# Patient Record
Sex: Female | Born: 1995 | Race: White | Hispanic: No | Marital: Single | Smoking: Never smoker
Health system: Southern US, Community
[De-identification: ages and names within clinical notes are randomized; demographics above are authoritative.]

## PROBLEM LIST (undated history)

## (undated) DIAGNOSIS — K648 Other hemorrhoids: Secondary | ICD-10-CM

## (undated) DIAGNOSIS — D649 Anemia, unspecified: Secondary | ICD-10-CM

## (undated) HISTORY — DX: Anemia, unspecified: D64.9

## (undated) HISTORY — PX: OTHER SURGICAL HISTORY: SHX169

## (undated) HISTORY — DX: Other hemorrhoids: K64.8

---
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
## 2016-09-24 ENCOUNTER — Observation Stay (HOSPITAL_COMMUNITY)
Admission: EM | Admit: 2016-09-24 | Discharge: 2016-09-26 | Disposition: A | Discharge status: Home / Self Care | Payer: 59 | Attending: Obstetrics and Gynecology | Admitting: Obstetrics and Gynecology

## 2016-09-24 ENCOUNTER — Encounter (HOSPITAL_COMMUNITY): Payer: Self-pay | Admitting: Emergency Medicine

## 2016-09-24 DIAGNOSIS — Z79899 Other long term (current) drug therapy: Secondary | ICD-10-CM | POA: Diagnosis not present

## 2016-09-24 DIAGNOSIS — K5900 Constipation, unspecified: Secondary | ICD-10-CM | POA: Diagnosis not present

## 2016-09-24 DIAGNOSIS — N83209 Unspecified ovarian cyst, unspecified side: Secondary | ICD-10-CM | POA: Diagnosis present

## 2016-09-24 DIAGNOSIS — R109 Unspecified abdominal pain: Secondary | ICD-10-CM

## 2016-09-24 DIAGNOSIS — N83202 Unspecified ovarian cyst, left side: Secondary | ICD-10-CM | POA: Diagnosis not present

## 2016-09-24 DIAGNOSIS — R1031 Right lower quadrant pain: Secondary | ICD-10-CM | POA: Diagnosis present

## 2016-09-24 LAB — CBC WITH DIFFERENTIAL/PLATELET
BASOS ABS: 0 10*3/uL (ref 0.0–0.1)
Basophils Relative: 0 %
Eosinophils Absolute: 0.1 10*3/uL (ref 0.0–0.7)
Eosinophils Relative: 1 %
HCT: 41.3 % (ref 36.0–46.0)
Hemoglobin: 14.2 g/dL (ref 12.0–15.0)
LYMPHS ABS: 2.6 10*3/uL (ref 0.7–4.0)
LYMPHS PCT: 34 %
MCH: 31.2 pg (ref 26.0–34.0)
MCHC: 34.4 g/dL (ref 30.0–36.0)
MCV: 90.8 fL (ref 78.0–100.0)
Monocytes Absolute: 0.5 10*3/uL (ref 0.1–1.0)
Monocytes Relative: 6 %
NEUTROS ABS: 4.5 10*3/uL (ref 1.7–7.7)
NEUTROS PCT: 59 %
PLATELETS: 238 10*3/uL (ref 150–400)
RBC: 4.55 MIL/uL (ref 3.87–5.11)
RDW: 12.8 % (ref 11.5–15.5)
WBC: 7.7 10*3/uL (ref 4.0–10.5)

## 2016-09-24 LAB — COMPREHENSIVE METABOLIC PANEL
ALBUMIN: 4.9 g/dL (ref 3.5–5.0)
ALT: 17 U/L (ref 14–54)
ANION GAP: 7 (ref 5–15)
AST: 23 U/L (ref 15–41)
Alkaline Phosphatase: 45 U/L (ref 38–126)
BUN: 12 mg/dL (ref 6–20)
CO2: 25 mmol/L (ref 22–32)
Calcium: 10.1 mg/dL (ref 8.9–10.3)
Chloride: 109 mmol/L (ref 101–111)
Creatinine, Ser: 0.85 mg/dL (ref 0.44–1.00)
GFR calc Af Amer: >60 mL/min (ref >60)
GFR calc non Af Amer: >60 mL/min (ref >60)
GLUCOSE: 101 mg/dL — AB (ref 65–99)
POTASSIUM: 3.8 mmol/L (ref 3.5–5.1)
SODIUM: 141 mmol/L (ref 135–145)
Total Bilirubin: 0.4 mg/dL (ref 0.3–1.2)
Total Protein: 7.8 g/dL (ref 6.5–8.1)

## 2016-09-24 LAB — URINALYSIS, ROUTINE W REFLEX MICROSCOPIC
Bilirubin Urine: NEGATIVE
GLUCOSE, UA: NEGATIVE mg/dL
Hgb urine dipstick: NEGATIVE
Ketones, ur: 15 mg/dL — AB
Leukocytes, UA: NEGATIVE
Nitrite: NEGATIVE
PH: 7 (ref 5.0–8.0)
Protein, ur: NEGATIVE mg/dL
SPECIFIC GRAVITY, URINE: 1.014 (ref 1.005–1.030)

## 2016-09-24 LAB — I-STAT BETA HCG BLOOD, ED (MC, WL, AP ONLY): I-stat hCG, quantitative: <5 m[IU]/mL (ref <5)

## 2016-09-24 LAB — LIPASE, BLOOD: Lipase: 20 U/L (ref 11–51)

## 2016-09-24 MED ORDER — SODIUM CHLORIDE 0.9 % IV SOLN
INTRAVENOUS | Status: DC
  Administered 2016-09-24: 18:00:00 via INTRAVENOUS

## 2016-09-24 MED ORDER — MAGNESIUM CITRATE PO SOLN
1.0000 | Freq: Once | ORAL | Status: AC
  Administered 2016-09-24: 1 via ORAL
  Filled 2016-09-24: qty 296

## 2016-09-24 MED ORDER — ACETAMINOPHEN 650 MG RE SUPP: 650.0000 mg | Freq: Four times a day (QID) | RECTAL | Status: DC | PRN

## 2016-09-24 MED ORDER — ACETAMINOPHEN 325 MG PO TABS
650.0000 mg | ORAL_TABLET | Freq: Four times a day (QID) | ORAL | Status: DC | PRN
Start: 2016-09-24 — End: 2016-09-26
  Administered 2016-09-25: 650 mg via ORAL
  Filled 2016-09-24: qty 2

## 2016-09-24 MED ORDER — DIPHENHYDRAMINE HCL 25 MG PO CAPS: 25.0000 mg | ORAL_CAPSULE | Freq: Four times a day (QID) | ORAL | Status: DC | PRN

## 2016-09-24 MED ORDER — ONDANSETRON HCL 4 MG/2ML IJ SOLN
4.0000 mg | Freq: Once | INTRAMUSCULAR | Status: AC
  Administered 2016-09-24: 4 mg via INTRAVENOUS
  Filled 2016-09-24: qty 2

## 2016-09-24 MED ORDER — LORAZEPAM 2 MG/ML IJ SOLN
0.5000 mg | Freq: Once | INTRAMUSCULAR | Status: AC
  Administered 2016-09-24: 0.5 mg via INTRAVENOUS
  Filled 2016-09-24: qty 1

## 2016-09-24 MED ORDER — ONDANSETRON 4 MG PO TBDP
4.0000 mg | ORAL_TABLET | Freq: Four times a day (QID) | ORAL | Status: DC | PRN
  Administered 2016-09-25: 4 mg via ORAL

## 2016-09-24 MED ORDER — MORPHINE SULFATE (PF) 4 MG/ML IV SOLN
4.0000 mg | Freq: Once | INTRAVENOUS | Status: AC
  Administered 2016-09-24: 4 mg via INTRAVENOUS
  Filled 2016-09-24: qty 1

## 2016-09-24 MED ORDER — MORPHINE SULFATE (PF) 2 MG/ML IV SOLN
2.0000 mg | INTRAVENOUS | Status: DC | PRN
  Administered 2016-09-24: 4 mg via INTRAVENOUS
  Administered 2016-09-25 (×2): 2 mg via INTRAVENOUS
  Administered 2016-09-25 (×2): 4 mg via INTRAVENOUS
  Filled 2016-09-24 (×2): qty 2
  Filled 2016-09-24 (×2): qty 1
  Filled 2016-09-24: qty 2

## 2016-09-24 MED ORDER — ONDANSETRON HCL 4 MG/2ML IJ SOLN
4.0000 mg | Freq: Four times a day (QID) | INTRAMUSCULAR | Status: DC | PRN
  Filled 2016-09-24: qty 2

## 2016-09-24 MED ORDER — BISACODYL 5 MG PO TBEC
5.0000 mg | DELAYED_RELEASE_TABLET | Freq: Every day | ORAL | Status: DC | PRN
  Filled 2016-09-24: qty 1

## 2016-09-24 MED ORDER — KCL IN DEXTROSE-NACL 20-5-0.45 MEQ/L-%-% IV SOLN
INTRAVENOUS | Status: DC
  Administered 2016-09-25 (×2): via INTRAVENOUS
  Filled 2016-09-24 (×4): qty 1000

## 2016-09-24 MED ORDER — HYDROCODONE-ACETAMINOPHEN 5-325 MG PO TABS: 1.0000 | ORAL_TABLET | ORAL | Status: DC | PRN

## 2016-09-24 MED ORDER — DIPHENHYDRAMINE HCL 50 MG/ML IJ SOLN: 25.0000 mg | Freq: Four times a day (QID) | INTRAMUSCULAR | Status: DC | PRN

## 2016-09-24 NOTE — ED Notes (Signed)
Pt reports bilateral hand numbness and tingling. MD made aware.

## 2016-09-24 NOTE — ED Notes (Signed)
General surgery at bedside. 

## 2016-09-24 NOTE — ED Notes (Signed)
Pt being sent from PCP for possible appendicitis

## 2016-09-24 NOTE — Consult Note (Signed)
Reason for Consult:abdominal pain Referring Physician: Vittoria Noreen is an 20 y.o. female.  HPI: 20 yo female with 48 hours of abdominal pain. Pain started Sunday afternoon. She had a bowel movement Sunday morning, she is normally having one formed BM daily. Pain has been located in right lower abdomen. Is constant, worse with movement, nothing has relieved the pain. She has had no previous similar episodes. She has some nausea, but no vomiting. She went to [facility] and underwent workup with CT and [place] both showing only fluid around her ovary. She went to her gynecologist today and was sent to the ER for continued pain. She denies fevers currently.  History reviewed. No pertinent past medical history.  History reviewed. No pertinent surgical history.  History reviewed. No pertinent family history.  Social History:  reports that she has never smoked. She has never used smokeless tobacco. She reports that she does not drink alcohol or use drugs.  Allergies: No Known Allergies  Medications: I have reviewed the patient's current medications.  Results for orders placed or performed during the hospital encounter of 09/24/16 (from the past 48 hour(s))  Lipase, blood     Status: None   Collection Time: 09/24/16  4:34 PM  Result Value Ref Range   Lipase 20 11 - 51 U/L  Comprehensive metabolic panel     Status: Abnormal   Collection Time: 09/24/16  4:34 PM  Result Value Ref Range   Sodium 141 135 - 145 mmol/L   Potassium 3.8 3.5 - 5.1 mmol/L   Chloride 109 101 - 111 mmol/L   CO2 25 22 - 32 mmol/L   Glucose, Bld 101 (H) 65 - 99 mg/dL   BUN 12 6 - 20 mg/dL   Creatinine, Ser 0.85 0.44 - 1.00 mg/dL   Calcium 10.1 8.9 - 10.3 mg/dL   Total Protein 7.8 6.5 - 8.1 g/dL   Albumin 4.9 3.5 - 5.0 g/dL   AST 23 15 - 41 U/L   ALT 17 14 - 54 U/L   Alkaline Phosphatase 45 38 - 126 U/L   Total Bilirubin 0.4 0.3 - 1.2 mg/dL   GFR calc non Af Amer >60 >60 mL/min   GFR calc Af Amer >60 >60  mL/min    Comment: (NOTE) The eGFR has been calculated using the CKD EPI equation. This calculation has not been validated in all clinical situations. eGFR's persistently <60 mL/min signify possible Chronic Kidney Disease.    Anion gap 7 5 - 15  CBC with Differential     Status: None   Collection Time: 09/24/16  4:34 PM  Result Value Ref Range   WBC 7.7 4.0 - 10.5 K/uL   RBC 4.55 3.87 - 5.11 MIL/uL   Hemoglobin 14.2 12.0 - 15.0 g/dL   HCT 41.3 36.0 - 46.0 %   MCV 90.8 78.0 - 100.0 fL   MCH 31.2 26.0 - 34.0 pg   MCHC 34.4 30.0 - 36.0 g/dL   RDW 12.8 11.5 - 15.5 %   Platelets 238 150 - 400 K/uL   Neutrophils Relative % 59 %   Neutro Abs 4.5 1.7 - 7.7 K/uL   Lymphocytes Relative 34 %   Lymphs Abs 2.6 0.7 - 4.0 K/uL   Monocytes Relative 6 %   Monocytes Absolute 0.5 0.1 - 1.0 K/uL   Eosinophils Relative 1 %   Eosinophils Absolute 0.1 0.0 - 0.7 K/uL   Basophils Relative 0 %   Basophils Absolute 0.0 0.0 -  0.1 K/uL  I-Stat beta hCG blood, ED     Status: None   Collection Time: 09/24/16  4:44 PM  Result Value Ref Range   I-stat hCG, quantitative <5.0 <5 mIU/mL   Comment 3            Comment:   GEST. AGE      CONC.  (mIU/mL)   <=1 WEEK        5 - 50     2 WEEKS       50 - 500     3 WEEKS       100 - 10,000     4 WEEKS     1,000 - 30,000        FEMALE AND NON-PREGNANT FEMALE:     LESS THAN 5 mIU/mL     No results found.  Review of Systems  Constitutional: Negative for chills and fever.  HENT: Negative for hearing loss.   Eyes: Negative for blurred vision and double vision.  Respiratory: Negative for cough and hemoptysis.   Cardiovascular: Negative for chest pain and palpitations.  Gastrointestinal: Positive for abdominal pain and nausea. Negative for vomiting.  Genitourinary: Negative for dysuria and urgency.  Musculoskeletal: Negative for myalgias and neck pain.  Skin: Negative for itching and rash.  Neurological: Negative for dizziness, tingling and headaches.   Endo/Heme/Allergies: Does not bruise/bleed easily.  Psychiatric/Behavioral: Negative for depression and suicidal ideas.   Blood pressure 118/71, pulse 92, temperature 98 F (36.7 C), temperature source Oral, resp. rate 22, last menstrual period 09/14/2016, SpO2 100 %. Physical Exam  Vitals reviewed. Constitutional: She is oriented to person, place, and time. She appears well-developed and well-nourished.  HENT:  Head: Normocephalic and atraumatic.  Eyes: Conjunctivae and EOM are normal. Pupils are equal, round, and reactive to light.  Neck: Normal range of motion. Neck supple.  Cardiovascular: Normal rate and regular rhythm.   Respiratory: Effort normal and breath sounds normal.  GI: Soft. Bowel sounds are normal. She exhibits no distension. There is tenderness in the right lower quadrant. There is guarding.  Musculoskeletal: Normal range of motion.  Neurological: She is alert and oriented to person, place, and time.  Skin: Skin is warm and dry.  Psychiatric: She has a normal mood and affect. Her behavior is normal.    Assessment/Plan: 20 yo female with 2 days of RLQ pain, previous leukocytosis, now resolved. She was diagnosed with ruptured cyst at [facility] and discharged from there. She continues to have abdominal pain. I spoke at length with the patient and family, first relaying that she has a normal leukocytosis, second that removing easily treatable diseases should be done first before doing a long work up.  1. In that mindset we will proceed with magnesium citrate.  2. If she continues to have pain, I recommend observation with gynecology consult  Arta Bruce Linea Calles 09/24/2016, 6:58 PM

## 2016-09-24 NOTE — ED Notes (Signed)
Pt has been told a urine sample is needed from her, says that she "just went 30 minutes ago". Will try again later.

## 2016-09-24 NOTE — ED Triage Notes (Addendum)
Pt c/o diffuse dull abdominal pain onset Sunday afternoon, since localized to right lower quadrant, intermittently sharp and dull, worse with movement. Nausea. No emesis or diarrhea. Psoas muscle irritation, rebound tenderness.

## 2016-09-24 NOTE — ED Notes (Signed)
MD at bedside. 

## 2016-09-24 NOTE — ED Provider Notes (Signed)
[facility]-EMERGENCY DEPT Provider Note   CSN: [id number] Arrival date & time: 09/24/16  1546     History   Chief Complaint Chief Complaint  Patient presents with  . Abdominal Pain    HPI Karina Atkins is a 20 y.o. female.  20 year old female presents complaining of 3 days of worsening right lower quadrant abdominal pain. Seen at [facility] for similar symptoms and according to the mother had a CT scan of the abdomen as well as a pelvic ultrasound which did show some fluid around the ovary but no signs of appendicitis or torsion. She did have a leukocytosis and her CBC was was 13,000. Was seen by her GYN doctor today due to worsening sharp pain that was worse with movement. No associated fever or vomiting. Denies any vaginal bleeding or discharge. No urinary symptoms. Patient has been taking Toradol with limited relief. No prior history of same. On the recommendation of her physician, and was sent here for further management.      History reviewed. No pertinent past medical history.  Patient Active Problem List   Diagnosis Date Noted  . 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 No pertinent surgical history.  OB History    No data available       Home Medications    Prior to Admission medications   Medication Sig Start Date End Date Taking? Authorizing Provider  ABSORICA 30 MG capsule Take 1 tablet by mouth Twice daily. 12/31/12   Historical Provider, MD  amitriptyline (ELAVIL) 25 MG tablet Take 1 tablet (25 mg total) by mouth at bedtime. 04/20/13   Enid Baas, MD  cyclobenzaprine (FLEXERIL) 5 MG tablet Take 1 tablet (5 mg total) by mouth at bedtime as needed for muscle spasms. 02/23/13   Enid Baas, MD    Family History History reviewed. No pertinent family history.  Social History Social History  Substance Use Topics  . Smoking status: Never Smoker  . Smokeless tobacco: Never Used  . Alcohol use No      Allergies   Review of patient's allergies indicates no known allergies.   Review of Systems Review of Systems  All other systems reviewed and are negative.    Physical Exam Updated Vital Signs BP 128/85   Pulse 69   Temp 98.2 F (36.8 C) (Oral)   Resp 17   LMP 09/14/2016   SpO2 100%   Physical Exam  Constitutional: She is oriented to person, place, and time. She appears well-developed and well-nourished.  Non-toxic appearance. No distress.  HENT:  Head: Normocephalic and atraumatic.  Eyes: Conjunctivae, EOM and lids are normal. Pupils are equal, round, and reactive to light.  Neck: Normal range of motion. Neck supple. No tracheal deviation present. No thyroid mass present.  Cardiovascular: Normal rate, regular rhythm and normal heart sounds.  Exam reveals no gallop.   No murmur heard. Pulmonary/Chest: Effort normal and breath sounds normal. No stridor. No respiratory distress. She has no decreased breath sounds. She has no wheezes. She has no rhonchi. She has no rales.  Abdominal: Soft. Normal appearance and bowel sounds are normal. She exhibits no distension. There is tenderness in the right lower quadrant. There is rigidity. There is no rebound and no CVA tenderness.    Musculoskeletal: Normal range of motion. She exhibits no edema or tenderness.  Neurological: She is alert and oriented to person, place, and time. She has normal strength. No cranial nerve deficit or sensory  deficit. GCS eye subscore is 4. GCS verbal subscore is 5. GCS motor subscore is 6.  Skin: Skin is warm and dry. No abrasion and no rash noted.  Psychiatric: She has a normal mood and affect. Her speech is normal and behavior is normal.  Nursing note and vitals reviewed.    ED Treatments / Results  Labs (all labs ordered are listed, but only abnormal results are displayed) Labs Reviewed  COMPREHENSIVE METABOLIC PANEL - Abnormal; Notable for the following:       Result Value   Glucose, Bld 101  (*)    All other components within normal limits  LIPASE, BLOOD  CBC WITH DIFFERENTIAL/PLATELET  URINALYSIS, ROUTINE W REFLEX MICROSCOPIC (NOT AT [facility])  I-STAT BETA HCG BLOOD, ED ([facility], [facility], [facility] ONLY)    EKG  EKG Interpretation None       Radiology No results found.  Procedures Procedures (including critical care time)  Medications Ordered in ED Medications  0.9 %  sodium chloride infusion (not administered)  morphine 4 MG/ML injection 4 mg (not administered)  ondansetron (ZOFRAN) injection 4 mg (not administered)     Initial Impression / Assessment and Plan / ED Course  I have reviewed the triage vital signs and the nursing notes.  Pertinent labs & imaging results that were available during my care of the patient were reviewed by me and considered in my medical decision making (see chart for details).  Clinical Course    Patient given 2 doses of IV pain medication and still is uncomfortable. She has been evaluated by general surgery and the plan will be to admit her for serial exams.  Final Clinical Impressions(s) / ED Diagnoses   Final diagnoses:  None    New Prescriptions New Prescriptions   No medications on file     Lorre NickAnthony Mita Vallo, MD 09/24/16 2115

## 2016-09-25 ENCOUNTER — Observation Stay (HOSPITAL_COMMUNITY): Payer: 59

## 2016-09-25 ENCOUNTER — Encounter (HOSPITAL_COMMUNITY): Payer: Self-pay | Admitting: Radiology

## 2016-09-25 LAB — BASIC METABOLIC PANEL
Anion gap: 6 (ref 5–15)
BUN: 12 mg/dL (ref 6–20)
CO2: 28 mmol/L (ref 22–32)
Calcium: 9.5 mg/dL (ref 8.9–10.3)
Chloride: 109 mmol/L (ref 101–111)
Creatinine, Ser: 0.93 mg/dL (ref 0.44–1.00)
GFR calc Af Amer: >60 mL/min (ref >60)
GLUCOSE: 107 mg/dL — AB (ref 65–99)
POTASSIUM: 3.6 mmol/L (ref 3.5–5.1)
Sodium: 143 mmol/L (ref 135–145)

## 2016-09-25 LAB — CBC
HEMATOCRIT: 38.7 % (ref 36.0–46.0)
Hemoglobin: 13.3 g/dL (ref 12.0–15.0)
MCH: 31.4 pg (ref 26.0–34.0)
MCHC: 34.4 g/dL (ref 30.0–36.0)
MCV: 91.3 fL (ref 78.0–100.0)
Platelets: 240 10*3/uL (ref 150–400)
RBC: 4.24 MIL/uL (ref 3.87–5.11)
RDW: 12.8 % (ref 11.5–15.5)
WBC: 10.5 10*3/uL (ref 4.0–10.5)

## 2016-09-25 MED ORDER — IOPAMIDOL (ISOVUE-300) INJECTION 61%: 30.0000 mL | Freq: Once | INTRAVENOUS | Status: DC | PRN

## 2016-09-25 MED ORDER — LORAZEPAM 2 MG/ML IJ SOLN
0.5000 mg | Freq: Four times a day (QID) | INTRAMUSCULAR | Status: DC | PRN
  Administered 2016-09-25: 0.5 mg via INTRAVENOUS
  Filled 2016-09-25: qty 1

## 2016-09-25 MED ORDER — KETOROLAC TROMETHAMINE 30 MG/ML IJ SOLN
30.0000 mg | Freq: Three times a day (TID) | INTRAMUSCULAR | Status: DC | PRN
  Administered 2016-09-25 – 2016-09-26 (×2): 30 mg via INTRAVENOUS
  Filled 2016-09-25 (×2): qty 1

## 2016-09-25 MED ORDER — POLYETHYLENE GLYCOL 3350 17 G PO PACK
17.0000 g | PACK | Freq: Every day | ORAL | Status: DC
  Administered 2016-09-25: 17 g via ORAL
  Filled 2016-09-25: qty 1

## 2016-09-25 MED ORDER — IOPAMIDOL (ISOVUE-300) INJECTION 61%
100.0000 mL | Freq: Once | INTRAVENOUS | Status: AC | PRN
  Administered 2016-09-25: 100 mL via INTRAVENOUS

## 2016-09-25 MED ORDER — BISACODYL 10 MG RE SUPP
10.0000 mg | Freq: Once | RECTAL | Status: AC
  Administered 2016-09-25: 10 mg via RECTAL
  Filled 2016-09-25 (×2): qty 1

## 2016-09-25 MED ORDER — OXYCODONE-ACETAMINOPHEN 5-325 MG PO TABS: 1.0000 | ORAL_TABLET | ORAL | Status: DC | PRN

## 2016-09-25 MED ORDER — PROMETHAZINE HCL 25 MG/ML IJ SOLN
12.5000 mg | INTRAMUSCULAR | Status: DC | PRN
  Administered 2016-09-25: 12.5 mg via INTRAVENOUS
  Filled 2016-09-25: qty 1

## 2016-09-25 NOTE — Progress Notes (Signed)
MD paged in request of heating pad. Gave order to give Kpad to patient

## 2016-09-25 NOTE — Progress Notes (Signed)
  Progress Note: General Surgery Service   Subjective: Pain continues, no BM overnight, +vomiting overnight  Objective: Vital signs in last 24 hours: Temp:  [97.4 F (36.3 C)-98.9 F (37.2 C)] 98.9 F (37.2 C) (10/04 0423) Pulse Rate:  [51-92] 58 (10/03 2303) Resp:  [15-24] 16 (10/04 0423) BP: (104-130)/(55-95) 110/95 (10/04 0423) SpO2:  [96 %-100 %] 100 % (10/04 0423) Last BM Date: 09/22/16  Intake/Output from previous day: 10/03 0701 - 10/04 0700 In: 554.2 [I.V.:554.2] Out: -  Intake/Output this shift: No intake/output data recorded.  Lungs: CTAB  Cardiovascular: bradycardic  Abd: soft, guarding in RLQ  Extremities: no edema  Neuro: AOx4  Lab Results: CBC   Recent Labs  09/24/16 1634 09/25/16 0456  WBC 7.7 10.5  HGB 14.2 13.3  HCT 41.3 38.7  PLT 238 240   BMET  Recent Labs  09/24/16 1634 09/25/16 0456  NA 141 143  K 3.8 3.6  CL 109 109  CO2 25 28  GLUCOSE 101* 107*  BUN 12 12  CREATININE 0.85 0.93  CALCIUM 10.1 9.5   PT/INR No results for input(s): LABPROT, INR in the last 72 hours. ABG No results for input(s): PHART, HCO3 in the last 72 hours.  Invalid input(s): PCO2, PO2  Studies/Results:  Anti-infectives: Anti-infectives    None      Medications: Scheduled Meds:  Continuous Infusions: . sodium chloride Stopped (09/24/16 2235)  . dextrose 5 % and 0.45 % NaCl with KCl 20 mEq/L 75 mL/hr at 09/25/16 0613   PRN Meds:.acetaminophen **OR** acetaminophen, bisacodyl, diphenhydrAMINE **OR** diphenhydrAMINE, HYDROcodone-acetaminophen, LORazepam, morphine injection, ondansetron **OR** ondansetron (ZOFRAN) IV  Assessment/Plan: Patient Active Problem List   Diagnosis Date Noted  . Ruptured cyst of ovary 09/24/2016  . 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

## 2016-09-25 NOTE — Progress Notes (Signed)
Pt reports pain is worse and a/w nausea and vomiting overnight  AF, VSS Gen - pt appears uncomfortable Abd - tender R>L w/ rebound Ext - NT  Labs reviewed - WBC 10  A/P:  RLQ pain CT ordered this am Phenergan ordered Continue NPO as likely need laparoscopy today

## 2016-09-25 NOTE — Progress Notes (Signed)
Pt continues to have pain in RLQ.  No further n/v but hasn't eaten today.  No BM since Sunday despite mag citrate  AF, VSS Gen - NAD Abd - soft, tender RLQ w/ gaurding Ext - NT, no edema  CT scan reviewed - left ovarian cyst, no change since previous MRI 2013.  No evidence of torsion or appendicitis  A/P:  RLQ pain Plan for management of constipation  toradol for pain now, percocet ordered Try PO intake this afternoon to see if able to achieve pain control with PO meds

## 2016-09-26 LAB — CBC
HEMATOCRIT: 36.9 % (ref 36.0–46.0)
HEMOGLOBIN: 12.5 g/dL (ref 12.0–15.0)
MCH: 31.5 pg (ref 26.0–34.0)
MCHC: 33.9 g/dL (ref 30.0–36.0)
MCV: 92.9 fL (ref 78.0–100.0)
PLATELETS: 211 10*3/uL (ref 150–400)
RBC: 3.97 MIL/uL (ref 3.87–5.11)
RDW: 13 % (ref 11.5–15.5)
WBC: 7.4 10*3/uL (ref 4.0–10.5)

## 2016-09-26 LAB — SURGICAL PCR SCREEN
MRSA, PCR: NEGATIVE
STAPHYLOCOCCUS AUREUS: NEGATIVE

## 2016-09-26 MED ORDER — OXYCODONE-ACETAMINOPHEN 5-325 MG PO TABS: 1.0000 | ORAL_TABLET | ORAL | 0 refills | Status: DC | PRN

## 2016-09-26 MED ORDER — IBUPROFEN 600 MG PO TABS: 600.0000 mg | ORAL_TABLET | Freq: Four times a day (QID) | ORAL | 1 refills | Status: DC | PRN

## 2016-09-26 MED ORDER — FLEET ENEMA 7-19 GM/118ML RE ENEM
1.0000 | ENEMA | Freq: Once | RECTAL | Status: AC
  Administered 2016-09-26: 1 via RECTAL
  Filled 2016-09-26: qty 1

## 2016-09-26 NOTE — Progress Notes (Signed)
Pt feeling some better today but still having RLQ pain.  Fleets enema this am with small results.    AF, VSS Gen - NAD - appears more comfortable in bed Abd - soft, tender RLQ Ext - NT, no edema  A/P:  RLQ pain Plan is for discharge this morning and f/u in my office Monday at 830am rx percocet/ibuprofen/OCP given

## 2016-09-26 NOTE — Progress Notes (Signed)
[facility] Progress Note     Subjective: Mother in the room. Pt Concerned about new vaginal discharge - clear/light brown in color. Denies physiologic discharge at baseline.   Persistent RLQ pain. Still with mild nausea, no vomiting. Tolerating PO but not eating much. Ambulating, urinating without hesitancy, and had a BM after enema.  Objective: Vital signs in last 24 hours: Temp:  [97.7 F (36.5 C)-98.6 F (37 C)] 98.6 F (37 C) (10/05 0948) Pulse Rate:  [45-68] 45 (10/05 0948) Resp:  [14-16] 15 (10/05 0948) BP: (90-113)/(38-66) 100/56 (10/05 0948) SpO2:  [100 %] 100 % (10/05 0948) Last BM Date: 09/22/16  Intake/Output from previous day: 10/04 0701 - 10/05 0700 In: 1658.8 [P.O.:250; I.V.:1408.8] Out: 300 [Urine:300] Intake/Output this shift: No intake/output data recorded.  PE: Gen:  Alert, in obvious abdominal discomfort, pleasant and cooperative Abd: Soft, diffusely TTP, most severe over RLQ with guarding and rebound tenderness, +BS  Lab Results:   Recent Labs  09/25/16 0456 09/26/16 0431  WBC 10.5 7.4  HGB 13.3 12.5  HCT 38.7 36.9  PLT 240 211   BMET  Recent Labs  09/24/16 1634 09/25/16 0456  NA 141 143  K 3.8 3.6  CL 109 109  CO2 25 28  GLUCOSE 101* 107*  BUN 12 12  CREATININE 0.85 0.93  CALCIUM 10.1 9.5   PT/INR No results for input(s): LABPROT, INR in the last 72 hours. CMP     Component Value Date/Time   NA 143 09/25/2016 0456   K 3.6 09/25/2016 0456   CL 109 09/25/2016 0456   CO2 28 09/25/2016 0456   GLUCOSE 107 (H) 09/25/2016 0456   BUN 12 09/25/2016 0456   CREATININE 0.93 09/25/2016 0456   CALCIUM 9.5 09/25/2016 0456   PROT 7.8 09/24/2016 1634   ALBUMIN 4.9 09/24/2016 1634   AST 23 09/24/2016 1634   ALT 17 09/24/2016 1634   ALKPHOS 45 09/24/2016 1634   BILITOT 0.4 09/24/2016 1634   GFRNONAA >60 09/25/2016 0456   GFRAA >60 09/25/2016 0456   Lipase     Component Value Date/Time   LIPASE 20 09/24/2016 1634        Studies/Results: Ct Abdomen Pelvis W Contrast  Result Date: 09/25/2016 CLINICAL DATA:  Right lower quadrant pain for several days EXAM: CT ABDOMEN AND PELVIS WITH CONTRAST TECHNIQUE: Multidetector CT imaging of the abdomen and pelvis was performed using the standard protocol following bolus administration of intravenous contrast. CONTRAST:  100mL ISOVUE-300 IOPAMIDOL (ISOVUE-300) INJECTION 61% COMPARISON:  None. FINDINGS: Lower chest: No acute abnormality. Hepatobiliary: No focal liver abnormality is seen. No gallstones, gallbladder wall thickening, or biliary dilatation. Pancreas: Unremarkable. No pancreatic ductal dilatation or surrounding inflammatory changes. Spleen: Normal in size without focal abnormality. Adrenals/Urinary Tract: Adrenal glands are unremarkable. Kidneys are normal, without renal calculi, focal lesion, or hydronephrosis. Bladder is unremarkable. Stomach/Bowel: Stomach is within normal limits. Appendix appears normal. No evidence of bowel wall thickening, distention, or inflammatory changes. The degree of retained fecal material is noted likely related to some degree of constipation. Vascular/Lymphatic: No significant vascular findings are present. No enlarged abdominal or pelvic lymph nodes. Reproductive: Uterus is within normal limits. Bilateral ovarian cystic changes are seen. The largest of these lies on the left measuring 4.0 cm. These were present on a prior MRI dated 2013. Other: No abdominal wall hernia or abnormality. No abdominopelvic ascites. Musculoskeletal: No acute or significant osseous findings. IMPRESSION: Bilateral ovarian cystic changes left greater than right stable from prior MRI examination.  Mild constipation. No other focal abnormality is seen. Electronically Signed   By: Alcide Clever M.D.   On: 09/25/2016 14:32   Assessment/Plan RLQ Abdominal pain - CT scan negative for acute appendicitis, probable ruptured cyst of ovary - afebrile, leukocytosis  resolved, having bowel function after enema  - persistent RLQ pain. Plan is for discharge, per Dr. Renaldo Fiddler, with pain medication and follow up with Dr. Renaldo Fiddler Monday 09/30/16   Patient has no acute surgical needs, appreciate Dr. Renaldo Fiddler assistance in managing this patients care.    LOS: 0 days    Adam Phenix , [facility] 09/26/2016, 10:38 AM Pager: [phone] Consults: [phone] Mon-Fri 7:00 am-4:30 pm Sat-Sun 7:00 am-11:30 am

## 2016-10-02 NOTE — Discharge Summary (Signed)
Physician Discharge Summary  Patient ID: Karina Atkins MRN: [id number] DOB/AGE: 21/08/1996 20 y.o.  Admit date: 09/24/2016 Discharge date: 10/02/2016  Admission Diagnoses:  RLQ pain  Discharge Diagnoses:  Active Problems:   Ruptured cyst of ovary   Discharged Condition: fair  Hospital Course: Pt was admitted with RLQ pain and n/v.  Her pain was initially controlled with IV meds and as she was able to tolerate PO - she was advanced to oral pain control.  Repeat CT scan confirmed normal appendix and showed ovarian cysts.  No evidence of torsion or hemorrhage.  As her pain improved, decision was made to follow with expectant management and defer surgery at this time.  Pt and family agree.    Consults: surgery  Significant Diagnostic Studies: labs: cbc and radiology: CT scan: results reviewed  Treatments: IV hydration and analgesia: Morphine and toradol  Discharge Exam: Blood pressure (!) 100/56, pulse (!) 45, temperature 98.6 F (37 C), temperature source Oral, resp. rate 15, last menstrual period 09/14/2016, SpO2 100 %. General appearance: alert and cooperative  Abd - soft, TTP R>L  Disposition: 01-Home or Self Care     Medication List    TAKE these medications   ibuprofen 600 MG tablet Commonly known as:  ADVIL Take 1 tablet (600 mg total) by mouth every 6 (six) hours as needed. What changed:  medication strength  how much to take  reasons to take this   ketorolac 10 MG tablet Commonly known as:  TORADOL Take 10 mg by mouth every 6 (six) hours as needed for severe pain.   Melatonin 5 MG Tabs Take 5 mg by mouth at bedtime as needed (sleep).   multivitamin with minerals Tabs tablet Take 1 tablet by mouth daily.   oxyCODONE-acetaminophen 5-325 MG tablet Commonly known as:  PERCOCET/ROXICET Take 1-2 tablets by mouth every 4 (four) hours as needed for severe pain.      Follow-up Information    Dotty Gonzalo, MD Follow up in 4 day(s).   Specialty:   Obstetrics and Gynecology Why:  f/u in my office 8:30am monday if not feeling better Contact information: [address], [address] [place] [address] [phone]           Signed: Hatem Cull 10/02/2016, 8:10 AM

## 2016-10-03 ENCOUNTER — Encounter: Payer: Self-pay | Admitting: Internal Medicine

## 2016-10-03 ENCOUNTER — Other Ambulatory Visit (INDEPENDENT_AMBULATORY_CARE_PROVIDER_SITE_OTHER): Payer: 59

## 2016-10-03 ENCOUNTER — Ambulatory Visit (INDEPENDENT_AMBULATORY_CARE_PROVIDER_SITE_OTHER): Payer: 59 | Admitting: Internal Medicine

## 2016-10-03 VITALS — BP 100/68 | HR 60 | Ht 68.0 in | Wt 134.4 lb

## 2016-10-03 DIAGNOSIS — R112 Nausea with vomiting, unspecified: Secondary | ICD-10-CM

## 2016-10-03 DIAGNOSIS — R1011 Right upper quadrant pain: Secondary | ICD-10-CM

## 2016-10-03 DIAGNOSIS — K921 Melena: Secondary | ICD-10-CM

## 2016-10-03 DIAGNOSIS — R110 Nausea: Secondary | ICD-10-CM

## 2016-10-03 DIAGNOSIS — R630 Anorexia: Secondary | ICD-10-CM | POA: Diagnosis not present

## 2016-10-03 LAB — COMPREHENSIVE METABOLIC PANEL
ALBUMIN: 4.9 g/dL (ref 3.5–5.2)
ALK PHOS: 39 U/L (ref 39–117)
ALT: 17 U/L (ref 0–35)
AST: 20 U/L (ref 0–37)
BUN: 13 mg/dL (ref 6–23)
CO2: 30 mEq/L (ref 19–32)
CREATININE: 0.95 mg/dL (ref 0.40–1.20)
Calcium: 10.6 mg/dL — ABNORMAL HIGH (ref 8.4–10.5)
Chloride: 106 mEq/L (ref 96–112)
GFR: 79.05 mL/min (ref >60.00)
GLUCOSE: 93 mg/dL (ref 70–99)
POTASSIUM: 4.4 meq/L (ref 3.5–5.1)
SODIUM: 143 meq/L (ref 135–145)
TOTAL PROTEIN: 7.7 g/dL (ref 6.0–8.3)
Total Bilirubin: 0.7 mg/dL (ref 0.2–1.2)

## 2016-10-03 LAB — CBC WITH DIFFERENTIAL/PLATELET
BASOS ABS: 0 10*3/uL (ref 0.0–0.1)
Basophils Relative: 0.5 % (ref 0.0–3.0)
EOS PCT: 0.4 % (ref 0.0–5.0)
Eosinophils Absolute: 0 10*3/uL (ref 0.0–0.7)
HCT: 42.3 % (ref 36.0–46.0)
Hemoglobin: 14.7 g/dL (ref 12.0–15.0)
LYMPHS ABS: 2.6 10*3/uL (ref 0.7–4.0)
Lymphocytes Relative: 34.5 % (ref 12.0–46.0)
MCHC: 34.7 g/dL (ref 30.0–36.0)
MCV: 90.3 fl (ref 78.0–100.0)
MONO ABS: 0.6 10*3/uL (ref 0.1–1.0)
MONOS PCT: 7.4 % (ref 3.0–12.0)
NEUTROS ABS: 4.3 10*3/uL (ref 1.4–7.7)
NEUTROS PCT: 57.2 % (ref 43.0–77.0)
PLATELETS: 232 10*3/uL (ref 150.0–400.0)
RBC: 4.69 Mil/uL (ref 3.87–5.11)
RDW: 12.5 % (ref 11.5–14.6)
WBC: 7.5 10*3/uL (ref 4.5–10.5)

## 2016-10-03 LAB — HIGH SENSITIVITY CRP: CRP, High Sensitivity: 0.47 mg/L (ref 0.000–5.000)

## 2016-10-03 MED ORDER — TRAMADOL HCL 50 MG PO TABS: 50.0000 mg | ORAL_TABLET | Freq: Four times a day (QID) | ORAL | 0 refills | Status: DC | PRN

## 2016-10-03 MED ORDER — AMOXICILLIN-POT CLAVULANATE 875-125 MG PO TABS: 1.0000 | ORAL_TABLET | Freq: Two times a day (BID) | ORAL | 0 refills | Status: DC

## 2016-10-03 NOTE — Progress Notes (Signed)
Patient ID: Karina Atkins, female   DOB: [birth year], 20 y.o.   MRN: [id number] HPI: Karina Atkins is a 20 year old female with little to no past medical history who is seen urgently today to evaluate ongoing abdominal pain. She's here today with her mother. Her symptoms started fairly acutely on October 1 after she ran to work in [place]. Initially she felt diffuse lower abdominal pain near the umbilicus. This persisted and she went to student health where she was told she may be constipated. She was advised to use MiraLAX and sent home. Later that night she went to the emergency department at [facility] where workup included CT scan. She was found to have a leukocytosis and fever and the pain localized to the right lower quadrant. She was seen by gynecology in consultation in the ER. CT scan was performed on 10-17 at [facility] and read overall as no acute findings. Her liver and gallbladder were unremarkable. Pancreas unremarkable. Kidneys unremarkable. Bowel no obstruction without acute inflammatory process. Appendix appeared unremarkable. Abdominal veins patent. There was a 3.8 left ovarian cysts seen. She did have an endovaginal ultrasound which showed a likely right luteal cyst measuring 1.2 x 1.2 x 0.9 cm with complex fluid possible representing hemorrhage adjacent to this. On the left the cyst was seen measuring 4 x 4 by 3.1 with layering debris. There is no evidence of torsion. She received IV antibiotics in the ER but eventually was discharged home to [place] with her family. She was not sent home with antibiotics  Several days later the pain persisted and seem to worsen and she wound up being admitted to [facility] where CT scan was repeated. Again this was unremarkable. Her leukocytosis had resolved and she had not had further fevers. They discussed possibly diagnostic laparoscopy but this did not occur.  She followed up with her gynecologist Dr. Renaldo Fiddler today who felt like symptoms very  unlikely to be GYN but more likely gastrointestinal. She had repeat pelvic ultrasound today and was told that there is still trace fluid but overall less than before. No additional adnexal pathology seen today per their report.  She continues to have near constant dull right lower quadrant pain which at times becomes sharp. It is worse with movement particularly worse with moving the right leg. Doesn't necessarily get better or worse with bowel movement. For the last 3 days she's had very loose stools when she tries to eat. She's been eating very little and lost about 10 pounds in the last 12 days. She struggled with nausea, low level on a near constant basis with intermittent vomiting. Vomiting usually occurs after she tries to eat. She was given ibuprofen and Percocet. Percocet makes her feel bad and ibuprofen started to bother her stomach so she stopped. She's tried Tylenol without much relief.  She did go back to [place] and try to attend classes earlier this week but this is been very difficult for her. 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  No tobacco use. No illicit drug use. Alcohol socially none recently. Not sexually active.  Of note prior to the onset of her symptoms her bowel habits have been normal for her. For her this is a daily formed bowel movement. She has not seen blood or melena in her stools before or recently.  History reviewed. No pertinent past medical history.  Past Surgical History:  Procedure Laterality Date  . none  Outpatient Medications Prior to Visit  Medication Sig Dispense Refill  . ibuprofen (ADVIL,MOTRIN) 600 MG tablet Take 1 tablet (600 mg total) by mouth every 6 (six) hours as needed. 30 tablet 1  . ketorolac (TORADOL) 10 MG tablet Take 10 mg by mouth every 6 (six) hours as needed for severe pain.     . Melatonin 5 MG TABS Take 5 mg by mouth at bedtime as needed (sleep).    . Multiple Vitamin  (MULTIVITAMIN WITH MINERALS) TABS tablet Take 1 tablet by mouth daily.    Marland Kitchen. oxyCODONE-acetaminophen (PERCOCET/ROXICET) 5-325 MG tablet Take 1-2 tablets by mouth every 4 (four) hours as needed for severe pain. 30 tablet 0   No facility-administered medications prior to visit.     No Known Allergies  Family History  Problem Relation Age of Onset  . Breast cancer Mother   . Irritable bowel syndrome Paternal Grandmother   . Pancreatic cancer Other   . Esophageal cancer Neg Hx   . Colon cancer Neg Hx   . Stomach cancer Neg Hx   . Inflammatory bowel disease Neg Hx     Social History  Substance Use Topics  . Smoking status: Never Smoker  . Smokeless tobacco: Never Used  . Alcohol use No    ROS: As per history of present illness, otherwise negative  BP 100/68   Pulse 60   Ht 5\' 8"  (1.727 m)   Wt 134 lb 6.4 oz (61 kg)   LMP 09/14/2016 Comment: negative HCG blood test 09-24-2016  BMI 20.44 kg/m  Constitutional: Well-developed and well-nourished. No distress. HEENT: Normocephalic and atraumatic. Oropharynx is clear and moist. No oropharyngeal exudate. Conjunctivae are normal.  No scleral icterus. Neck: Neck supple. Trachea midline. Cardiovascular: Normal rate, regular rhythm and intact distal pulses. No M/R/G Pulmonary/chest: Effort normal and breath sounds normal. No wheezing, rales or rhonchi. Abdominal: Soft, Significant right middle and lower quadrant tenderness without rebound or guarding, nondistended. Bowel sounds active throughout. There are no masses palpable. No hepatosplenomegaly. Extremities: no clubbing, cyanosis, or edema Lymphadenopathy: No cervical adenopathy noted. Neurological: Alert and oriented to person place and time. Skin: Skin is warm and dry. No rashes noted. Psychiatric: Normal mood and affect. Behavior is normal.  RELEVANT LABS AND IMAGING: CBC    Component Value Date/Time   WBC 7.4 09/26/2016 0431   RBC 3.97 09/26/2016 0431   HGB 12.5 09/26/2016  0431   HCT 36.9 09/26/2016 0431   PLT 211 09/26/2016 0431   MCV 92.9 09/26/2016 0431   MCH 31.5 09/26/2016 0431   MCHC 33.9 09/26/2016 0431   RDW 13.0 09/26/2016 0431   LYMPHSABS 2.6 09/24/2016 1634   MONOABS 0.5 09/24/2016 1634   EOSABS 0.1 09/24/2016 1634   BASOSABS 0.0 09/24/2016 1634    CMP     Component Value Date/Time   NA 143 09/25/2016 0456   K 3.6 09/25/2016 0456   CL 109 09/25/2016 0456   CO2 28 09/25/2016 0456   GLUCOSE 107 (H) 09/25/2016 0456   BUN 12 09/25/2016 0456   CREATININE 0.93 09/25/2016 0456   CALCIUM 9.5 09/25/2016 0456   PROT 7.8 09/24/2016 1634   ALBUMIN 4.9 09/24/2016 1634   AST 23 09/24/2016 1634   ALT 17 09/24/2016 1634   ALKPHOS 45 09/24/2016 1634   BILITOT 0.4 09/24/2016 1634   GFRNONAA >60 09/25/2016 0456   GFRAA >60 09/25/2016 0456    ASSESSMENT/PLAN: 20 year old female with little to no past medical history who is seen urgently today to  evaluate ongoing abdominal pain  1. RLQ pain -- I am suspicious for smoldering appendicitis despite negative CT scan 2. She is nontoxic-appearing and not febrile. I'm going to repeat CBC CMP today and check a CRP. She's had loose stools and some checking fecal leukocytes and a GI pathogen panel. Overall picture is not consistent with an infectious enteritis or colitis. Other consideration include terminal ileitis (not seen by CT and generally doesn't start suddenly), cecal diverticulitis (not seen by CT), Meckel's diverticulitis (also not seen by CT), mesenteric adenitis and uterine, fallopian or ovarian pathology (felt not the cause with close Gyn involvement). Symptoms are not consistent with cholecystitis. --I'm going to treat with antibiotics for smoldering appendicitis, even though they know traditionally appendicitis would be treated with appendectomy. She has seen surgery who did not feel like this was a surgical issue. I'm starting Augmentin 875 twice a day 10 days. If not better in 48-72 hours, or  certainly at anytime if worsening, my recommendation would be for surgical reinvolvement and consideration of diagnostic laparoscopy. I have explained this to both Janie and her mom. --Tramadol 50 mg every 6 hours when necessary. Discontinue Percocet because she doesn't like how it makes her feel and likely exacerbating nausea --Zofran 4-8 mg every 6-8 hours as needed for nausea. I advised she may want to take this schedule. Max dose 24 mg in 24 hours --[place] with focus on oral hydration with fluids --We did discuss right upper quadrant ultrasound but gallbladder is been normal on 2 CTs and pain is right lower rather than right upper    AV:[facility] [place], [place] [address] [place], [place] [address]

## 2016-10-03 NOTE — Patient Instructions (Signed)
Your physician has requested that you go to the basement for the following lab work before leaving today: CBC, CMP, CRP, GI Pathogen, Fecal leukocytes  We have sent the following medications to your pharmacy for you to pick up at your convenience: augmentin 875 mg twice daily x 10 days Tramadol 50 mg every 6 hours as needed for pain  Continue Zofran as needed.  Make sure to drink plenty of fluids!  Please follow a bland diet (examples on attached sheet)  If you are age 21 or older, your body mass index should be between 23-30. Your Body mass index is 20.44 kg/m. If this is out of the aforementioned range listed, please consider follow up with your Primary Care Provider.  If you are age 21 or younger, your body mass index should be between 19-25. Your Body mass index is 20.44 kg/m. If this is out of the aformentioned range listed, please consider follow up with your Primary Care Provider.

## 2016-10-04 LAB — GASTROINTESTINAL PATHOGEN PANEL PCR
C. difficile Tox A/B, PCR: NOT DETECTED
CAMPYLOBACTER, PCR: NOT DETECTED
Cryptosporidium, PCR: NOT DETECTED
E COLI (ETEC) LT/ST, PCR: NOT DETECTED
E coli (STEC) stx1/stx2, PCR: NOT DETECTED
E coli 0157, PCR: NOT DETECTED
GIARDIA LAMBLIA, PCR: NOT DETECTED
NOROVIRUS, PCR: NOT DETECTED
Rotavirus A, PCR: NOT DETECTED
SALMONELLA, PCR: NOT DETECTED
SHIGELLA, PCR: NOT DETECTED

## 2016-10-07 ENCOUNTER — Other Ambulatory Visit: Payer: Self-pay

## 2016-10-07 ENCOUNTER — Telehealth: Payer: Self-pay

## 2016-10-07 DIAGNOSIS — R1011 Right upper quadrant pain: Secondary | ICD-10-CM

## 2016-10-07 NOTE — Telephone Encounter (Signed)
Pt needs US of abd per Dr. Rhea BeltonPyrtle for RUQ abd pain asap. [facility] scheduled [facility]d [facility] 10/08/16@7 :30am, pt to arrive there at 7:15am. Pt to be NPO after midnight. Pts mother aware of appt.

## 2016-10-08 ENCOUNTER — Ambulatory Visit (HOSPITAL_COMMUNITY)
Admission: RE | Admit: 2016-10-08 | Discharge: 2016-10-08 | Disposition: A | Discharge status: Home / Self Care | Payer: 59 | Source: Ambulatory Visit | Attending: Internal Medicine | Admitting: Internal Medicine

## 2016-10-08 ENCOUNTER — Ambulatory Visit (HOSPITAL_COMMUNITY): Payer: 59

## 2016-10-08 DIAGNOSIS — R1011 Right upper quadrant pain: Secondary | ICD-10-CM | POA: Diagnosis not present

## 2016-10-08 LAB — FECAL LEUKOCYTES

## 2016-10-08 LAB — SPECIMEN STATUS REPORT

## 2016-10-18 ENCOUNTER — Other Ambulatory Visit: Payer: Self-pay | Admitting: General Surgery

## 2016-10-18 DIAGNOSIS — R1011 Right upper quadrant pain: Secondary | ICD-10-CM

## 2016-10-21 ENCOUNTER — Other Ambulatory Visit: Payer: Self-pay | Admitting: General Surgery

## 2016-10-21 DIAGNOSIS — R1011 Right upper quadrant pain: Secondary | ICD-10-CM

## 2016-10-23 ENCOUNTER — Other Ambulatory Visit: Payer: Self-pay | Admitting: *Deleted

## 2016-10-23 ENCOUNTER — Ambulatory Visit (HOSPITAL_COMMUNITY)
Admission: RE | Admit: 2016-10-23 | Discharge: 2016-10-23 | Disposition: A | Discharge status: Home / Self Care | Payer: 59 | Source: Ambulatory Visit | Attending: General Surgery | Admitting: General Surgery

## 2016-10-23 DIAGNOSIS — R1011 Right upper quadrant pain: Secondary | ICD-10-CM

## 2016-10-23 DIAGNOSIS — R109 Unspecified abdominal pain: Secondary | ICD-10-CM | POA: Insufficient documentation

## 2016-10-23 DIAGNOSIS — K921 Melena: Secondary | ICD-10-CM

## 2016-10-23 DIAGNOSIS — R195 Other fecal abnormalities: Secondary | ICD-10-CM

## 2016-10-23 MED ORDER — NA SULFATE-K SULFATE-MG SULF 17.5-3.13-1.6 GM/177ML PO SOLN: ORAL | 0 refills | Status: DC

## 2016-10-23 MED ORDER — TECHNETIUM TC 99M MEBROFENIN IV KIT
5.0000 | PACK | Freq: Once | INTRAVENOUS | Status: AC | PRN
  Administered 2016-10-23: 5 via INTRAVENOUS

## 2016-10-23 NOTE — Telephone Encounter (Signed)
Per Dr Rhea BeltonPyrtle, patient to be added to Dr Armbruster's schedule for Friday 10/25/16 @ 2:30 pm for endoscopy and colonoscopy due to loose stool, blood in stool and abdominal pain. Dr Adela LankArmbruster has been spoken to and agrees. Patient is coming for prep instructions today.

## 2016-10-24 ENCOUNTER — Telehealth: Payer: Self-pay | Admitting: Internal Medicine

## 2016-10-24 NOTE — Telephone Encounter (Signed)
Yesterday on 10/23/2016 spoke to Dr. Harden MoMatt Wakefield, Dr. Bernadene Personicky Aronson regarding Karina Atkins's ongoing issues with right-sided abdominal pain. Dr. Dwain SarnaWakefield has followed up with her over the last several weeks and symptoms have failed to improve in any significant way. He performed a HIDA scan yesterday which was normal She is also had ongoing intermittent loose stools and recently noticed some bright red blood with these bowel movements. She continues to have nausea and very poor appetite After my discussion with Dr. Dwain SarnaWakefield and Dr. Jacky KindleAronson we all feel that upper endoscopy and colonoscopy is the next reasonable diagnostic test. If these are negative Dr. Dwain SarnaWakefield is planning diagnostic laparoscopy I discussed this with [name] and her mother by phone She would like to procedures done as soon as possible and I cannot accommodate until next week on Tuesday With this in mind, Dr. Adela LankArmbruster has openings and has agreed to perform upper endoscopy and colonoscopy on Friday afternoon, 10/25/2016 2:30 Patient and mother aware and she has already been for previsit Please send results to Dr. Dwain SarnaWakefield, Dr. Jacky KindleAronson, and Dr. Renaldo FiddlerAdkins

## 2016-10-25 ENCOUNTER — Encounter: Payer: Self-pay | Admitting: Gastroenterology

## 2016-10-25 ENCOUNTER — Ambulatory Visit (AMBULATORY_SURGERY_CENTER): Payer: 59 | Admitting: Gastroenterology

## 2016-10-25 VITALS — BP 106/51 | HR 65 | Temp 98.4°F | Resp 14 | Ht 68.0 in | Wt 134.0 lb

## 2016-10-25 DIAGNOSIS — K317 Polyp of stomach and duodenum: Secondary | ICD-10-CM

## 2016-10-25 DIAGNOSIS — R195 Other fecal abnormalities: Secondary | ICD-10-CM

## 2016-10-25 DIAGNOSIS — R109 Unspecified abdominal pain: Secondary | ICD-10-CM | POA: Diagnosis not present

## 2016-10-25 DIAGNOSIS — R1011 Right upper quadrant pain: Secondary | ICD-10-CM | POA: Diagnosis not present

## 2016-10-25 MED ORDER — FLEET ENEMA 7-19 GM/118ML RE ENEM
1.0000 | ENEMA | Freq: Once | RECTAL | Status: AC
  Administered 2016-10-25: 1 via RECTAL

## 2016-10-25 MED ORDER — SODIUM CHLORIDE 0.9 % IV SOLN
500.0000 mL | INTRAVENOUS | Status: AC
Start: 2016-10-25 — End: ?

## 2016-10-25 NOTE — Op Note (Signed)
[facility] Patient Name: Karina MantleJane Atkins Procedure Date: 10/25/2016 2:04 PM MRN: [id number] Endoscopist: Viviann SpareSteven P. Belvie Iribe MD, MD Age: [age] Referring MD:  Date of Birth: 11/07/1996 Gender: Female Account #: [id number] Procedure:                Upper GI endoscopy Indications:              Abdominal pain of unclear etiology, loose stools Medicines:                Monitored Anesthesia Care Procedure:                Pre-Anesthesia Assessment:                           - Prior to the procedure, a History and Physical                            was performed, and patient medications and                            allergies were reviewed. The patient's tolerance of                            previous anesthesia was also reviewed. The risks                            and benefits of the procedure and the sedation                            options and risks were discussed with the patient.                            All questions were answered, and informed consent                            was obtained. Prior Anticoagulants: The patient has                            taken no previous anticoagulant or antiplatelet                            agents. ASA Grade Assessment: II - A patient with                            mild systemic disease. After reviewing the risks                            and benefits, the patient was deemed in                            satisfactory condition to undergo the procedure.                           After obtaining informed consent, the endoscope was  passed under direct vision. Throughout the                            procedure, the patient's blood pressure, pulse, and                            oxygen saturations were monitored continuously. The                            Model GIF-HQ190 [phone]) scope was introduced                            through the mouth, and advanced to the second part   of duodenum. The upper GI endoscopy was                            accomplished without difficulty. The patient                            tolerated the procedure well. Scope In: Scope Out: Findings:                 Esophagogastric landmarks were identified: the                            Z-line was found at 40 cm, the gastroesophageal                            junction was found at 40 cm and the upper extent of                            the gastric folds was found at 40 cm from the                            incisors.                           The exam of the esophagus was otherwise normal.                           The entire examined stomach was normal. Biopsies                            were taken with a cold forceps for Helicobacter                            pylori testing.                           A single diminutive benign appearing sessile polyp                            was found in the second portion of the duodenum.  The polyp was removed with a cold biopsy forceps.                            Resection and retrieval were complete.                           The exam of the duodenum was otherwise normal.                           Biopsies for histology were taken with a cold                            forceps in the duodenal bulb and in the second                            portion of the duodenum for evaluation of celiac                            disease. Complications:            No immediate complications. Estimated blood loss:                            Minimal. Estimated Blood Loss:     Estimated blood loss was minimal. Impression:               - Esophagogastric landmarks identified.                           - Esophagus otherwise normal                           - Normal stomach. Biopsied to rule out H pylori                           - A single benign appearing duodenal polyp.                            Resected and retrieved.                            - Normal duodenum otherwise. Biopsies were taken                            with a cold forceps for evaluation of celiac                            disease.                           Overall no obvious cause for pain on this exam,                            will await biopsy results. Recommendation:           - Patient has a contact number available for  emergencies. The signs and symptoms of potential                            delayed complications were discussed with the                            patient. Return to normal activities tomorrow.                            Written discharge instructions were provided to the                            patient.                           - Resume previous diet.                           - Continue present medications.                           - Await pathology results. Viviann Spare P. Cheryllynn Sarff MD, MD 10/25/2016 3:04:52 PM This report has been signed electronically.

## 2016-10-25 NOTE — Progress Notes (Signed)
Report to PACU, RN, vss, BBS= Clear.  

## 2016-10-25 NOTE — Op Note (Signed)
[facility] Patient Name: Karina Atkins Procedure Date: 10/25/2016 2:04 PM MRN: [id number] Endoscopist: Viviann Spare P. Merrik Puebla MD, MD Age: 20 Referring MD:  Date of Birth: [birth year] Gender: Female Account #: [id number] Procedure:                Colonoscopy Indications:              Abdominal pain in the right abdomen of unclear                            etiology, chronic loose stools Medicines:                Monitored Anesthesia Care Procedure:                Pre-Anesthesia Assessment:                           - Prior to the procedure, a History and Physical                            was performed, and patient medications and                            allergies were reviewed. The patient's tolerance of                            previous anesthesia was also reviewed. The risks                            and benefits of the procedure and the sedation                            options and risks were discussed with the patient.                            All questions were answered, and informed consent                            was obtained. Prior Anticoagulants: The patient has                            taken no previous anticoagulant or antiplatelet                            agents. ASA Grade Assessment: II - A patient with                            mild systemic disease. After reviewing the risks                            and benefits, the patient was deemed in                            satisfactory condition to undergo the procedure.  After obtaining informed consent, the colonoscope                            was passed under direct vision. Throughout the                            procedure, the patient's blood pressure, pulse, and                            oxygen saturations were monitored continuously. The                            Model PCF-H190L [id number]) scope was introduced                            through the anus and  advanced to the the terminal                            ileum, with identification of the appendiceal                            orifice and IC valve. The colonoscopy was performed                            without difficulty. The patient tolerated the                            procedure well. The quality of the bowel                            preparation was adequate. The terminal ileum,                            ileocecal valve, appendiceal orifice, and rectum                            were photographed. Scope In: 2:34:24 PM Scope Out: 2:54:22 PM Scope Withdrawal Time: 0 hours 15 minutes 27 seconds  Total Procedure Duration: 0 hours 19 minutes 58 seconds  Findings:                 The perianal and digital rectal examinations were                            normal.                           The terminal ileum was deeply intubated and                            appeared normal.                           The colon was tortuous.  Non-bleeding internal hemorrhoids were found during                            retroflexion. The hemorrhoids were small.                           The exam was otherwise without abnormality.                           Biopsies for histology were taken with a cold                            forceps from the right colon, left colon and                            transverse colon for evaluation of microscopic                            colitis. Complications:            No immediate complications. Estimated blood loss:                            Minimal. Estimated Blood Loss:     Estimated blood loss was minimal. Impression:               - The examined portion of the ileum was normal.                           - Tortuous colon.                           - Non-bleeding internal hemorrhoids.                           - The examination was otherwise normal. No                            inflammatory changes.                           -  Biopsies were taken with a cold forceps from the                            right colon, left colon and transverse colon for                            evaluation of microscopic colitis.                           Overall, no clear cause for abdominal pain on this                            exam. Recommendation:           - Patient has a contact number available for  emergencies. The signs and symptoms of potential                            delayed complications were discussed with the                            patient. Return to normal activities tomorrow.                            Written discharge instructions were provided to the                            patient.                           - Resume previous diet.                           - Continue present medications.                           - Await pathology results. Viviann SpareSteven P. Olamide Carattini MD, MD 10/25/2016 3:00:11 PM This report has been signed electronically.

## 2016-10-25 NOTE — Telephone Encounter (Signed)
Patient underwent upper and lower endoscopy this afternoon, both essentially normal with some biopsies taken. Results sent to Drs. Romie LeveeWakefield, [place], and [place].

## 2016-10-25 NOTE — Patient Instructions (Signed)
YOU HAD AN ENDOSCOPIC PROCEDURE TODAY AT THE [facility]:   Refer to the procedure report that was given to you for any specific questions about what was found during the examination.  If the procedure report does not answer your questions, please call your gastroenterologist to clarify.  If you requested that your care partner not be given the details of your procedure findings, then the procedure report has been included in a sealed envelope for you to review at your convenience later.  YOU SHOULD EXPECT: Some feelings of bloating in the abdomen. Passage of more gas than usual.  Walking can help get rid of the air that was put into your GI tract during the procedure and reduce the bloating. If you had a lower endoscopy (such as a colonoscopy or flexible sigmoidoscopy) you may notice spotting of blood in your stool or on the toilet paper. If you underwent a bowel prep for your procedure, you may not have a normal bowel movement for a few days.  Please Note:  You might notice some irritation and congestion in your nose or some drainage.  This is from the oxygen used during your procedure.  There is no need for concern and it should clear up in a day or so.  SYMPTOMS TO REPORT IMMEDIATELY:   Following lower endoscopy (colonoscopy or flexible sigmoidoscopy):  Excessive amounts of blood in the stool  Significant tenderness or worsening of abdominal pains  Swelling of the abdomen that is new, acute  Fever of 100F or higher   Following upper endoscopy (EGD)  Vomiting of blood or coffee ground material  New chest pain or pain under the shoulder blades  Painful or persistently difficult swallowing  New shortness of breath  Fever of 100F or higher  Black, tarry-looking stools  For urgent or emergent issues, a gastroenterologist can be reached at any hour by calling [phone].   DIET:  We do recommend a small meal at first, but then you may proceed to your regular diet.  Drink  plenty of fluids but you should avoid alcoholic beverages for 24 hours.  ACTIVITY:  You should plan to take it easy for the rest of today and you should NOT DRIVE or use heavy machinery until tomorrow (because of the sedation medicines used during the test).    FOLLOW UP: Our staff will call the number listed on your records the next business day following your procedure to check on you and address any questions or concerns that you may have regarding the information given to you following your procedure. If we do not reach you, we will leave a message.  However, if you are feeling well and you are not experiencing any problems, there is no need to return our call.  We will assume that you have returned to your regular daily activities without incident.  If any biopsies were taken you will be contacted by phone or by letter within the next 1-3 weeks.  Please call us at [phone] if you have not heard about the biopsies in 3 weeks.    SIGNATURES/CONFIDENTIALITY: You and/or your care partner have signed paperwork which will be entered into your electronic medical record.  These signatures attest to the fact that that the information above on your After Visit Summary has been reviewed and is understood.  Full responsibility of the confidentiality of this discharge information lies with you and/or your care-partner.  Await pathology results.  Hemorrhoid handout given.

## 2016-10-25 NOTE — Progress Notes (Signed)
Called to room to assist during endoscopic procedure.  Patient ID and intended procedure confirmed with present staff. Received instructions for my participation in the procedure from the performing physician.  

## 2016-10-28 ENCOUNTER — Telehealth: Payer: Self-pay

## 2016-10-28 NOTE — Telephone Encounter (Signed)
Left message on answering machine. 

## 2016-12-06 ENCOUNTER — Ambulatory Visit
Admission: RE | Admit: 2016-12-06 | Discharge: 2016-12-06 | Disposition: A | Discharge status: Home / Self Care | Payer: 59 | Source: Ambulatory Visit | Attending: General Surgery | Admitting: General Surgery

## 2016-12-06 ENCOUNTER — Other Ambulatory Visit: Payer: Self-pay | Admitting: General Surgery

## 2016-12-06 DIAGNOSIS — R109 Unspecified abdominal pain: Secondary | ICD-10-CM

## 2016-12-06 MED ORDER — GADOBENATE DIMEGLUMINE 529 MG/ML IV SOLN
12.0000 mL | Freq: Once | INTRAVENOUS | Status: AC | PRN
  Administered 2016-12-06: 12 mL via INTRAVENOUS

## 2016-12-12 ENCOUNTER — Encounter: Payer: Self-pay | Admitting: *Deleted

## 2016-12-13 ENCOUNTER — Encounter: Payer: Self-pay | Admitting: Internal Medicine

## 2016-12-13 ENCOUNTER — Ambulatory Visit (INDEPENDENT_AMBULATORY_CARE_PROVIDER_SITE_OTHER): Payer: 59 | Admitting: Internal Medicine

## 2016-12-13 ENCOUNTER — Other Ambulatory Visit (INDEPENDENT_AMBULATORY_CARE_PROVIDER_SITE_OTHER): Payer: 59

## 2016-12-13 VITALS — BP 110/78 | HR 64 | Ht 67.0 in | Wt 134.0 lb

## 2016-12-13 DIAGNOSIS — R1031 Right lower quadrant pain: Secondary | ICD-10-CM

## 2016-12-13 DIAGNOSIS — R195 Other fecal abnormalities: Secondary | ICD-10-CM

## 2016-12-13 DIAGNOSIS — R110 Nausea: Secondary | ICD-10-CM

## 2016-12-13 DIAGNOSIS — R61 Generalized hyperhidrosis: Secondary | ICD-10-CM

## 2016-12-13 DIAGNOSIS — K30 Functional dyspepsia: Secondary | ICD-10-CM

## 2016-12-13 LAB — CBC WITH DIFFERENTIAL/PLATELET
Basophils Absolute: 0 10*3/uL (ref 0.0–0.1)
Basophils Relative: 0.4 % (ref 0.0–3.0)
EOS ABS: 0.1 10*3/uL (ref 0.0–0.7)
EOS PCT: 0.5 % (ref 0.0–5.0)
HCT: 40.7 % (ref 36.0–46.0)
HEMOGLOBIN: 14.2 g/dL (ref 12.0–15.0)
Lymphocytes Relative: 34.2 % (ref 12.0–46.0)
Lymphs Abs: 3.4 10*3/uL (ref 0.7–4.0)
MCHC: 34.9 g/dL (ref 30.0–36.0)
MCV: 91.2 fl (ref 78.0–100.0)
MONO ABS: 0.6 10*3/uL (ref 0.1–1.0)
Monocytes Relative: 6.4 % (ref 3.0–12.0)
Neutro Abs: 5.8 10*3/uL (ref 1.4–7.7)
Neutrophils Relative %: 58.5 % (ref 43.0–77.0)
Platelets: 240 10*3/uL (ref 150.0–400.0)
RBC: 4.47 Mil/uL (ref 3.87–5.11)
RDW: 13.1 % (ref 11.5–15.5)
WBC: 9.9 10*3/uL (ref 4.0–10.5)

## 2016-12-13 LAB — HIGH SENSITIVITY CRP: CRP, High Sensitivity: 1.83 mg/L (ref 0.000–5.000)

## 2016-12-13 LAB — IBC PANEL
IRON: 138 ug/dL (ref 42–145)
SATURATION RATIOS: 30.8 % (ref 20.0–50.0)
TRANSFERRIN: 320 mg/dL (ref 212.0–360.0)

## 2016-12-13 LAB — IGA: IgA: 151 mg/dL (ref 68–378)

## 2016-12-13 LAB — SEDIMENTATION RATE: Sed Rate: 1 mm/hr (ref 0–20)

## 2016-12-13 LAB — CORTISOL: CORTISOL PLASMA: 16.7 ug/dL

## 2016-12-13 MED ORDER — RIFAXIMIN 550 MG PO TABS: 550.0000 mg | ORAL_TABLET | Freq: Three times a day (TID) | ORAL | 0 refills | Status: DC

## 2016-12-13 MED ORDER — PANTOPRAZOLE SODIUM 40 MG PO TBEC: 40.0000 mg | DELAYED_RELEASE_TABLET | Freq: Every day | ORAL | 3 refills | Status: DC

## 2016-12-13 NOTE — Patient Instructions (Signed)
Your physician has requested that you go to the basement for lab work before leaving today.  We have sent your demographic information and a prescription for Xifaxan to [facility]. This pharmacy is able to get medication approved through insurance and get you the lowest copay possible. If you have not heard from them within 1 week, please call our office at [phone] to let us know.  We have sent the following medications to your pharmacy for you to pick up at your convenience: Pantoprazole 40 mg daily  If you are age 21 or older, your body mass index should be between 23-30. Your Body mass index is 20.99 kg/m. If this is out of the aforementioned range listed, please consider follow up with your Primary Care Provider.  If you are age 21 or younger, your body mass index should be between 19-25. Your Body mass index is 20.99 kg/m. If this is out of the aformentioned range listed, please consider follow up with your Primary Care Provider.

## 2016-12-13 NOTE — Progress Notes (Signed)
Subjective:    Patient ID: Karina Atkins, female    DOB: [birth year], 21 y.o.   MRN: [id number]  HPI Karina Atkins is a 20 year old female with little past medical history other than over the last 3 months who returns for follow-up. She is here today with her mother. She was initially seen on 10/03/2016 to evaluate ongoing abdominal pain. She was initially diagnosed with ruptured right ovarian cyst and was briefly admitted to the hospital. I empirically treated her for smoldering appendicitis with antibiotics after her first visit with me but symptoms failed to improve. She's followed with Dr. Donne Hazel with surgery closely over the last few months. Diagnostic laparoscopy has been considered multiple times but due to slow improvement has not been performed.  She is seen again today for persistent symptoms which remain troublesome to her and her family.  She reports that she continues to have right lower quadrant abdominal discomfort. This is significantly worse with movement and exercise. She had previously been a frequent runner often 5 miles per day but she has been unable to exercise due to the discomfort. She states she tried to run yesterday but stopped after 5 minutes due to discomfort. The pain is often absent if she is still but is quickly there with movement. She has noticed intermittent loose stools which have persisted over the last several months. She is also reports indigestion and belching as well as heartburn. She took Nexium 20 mg over-the-counter for a few weeks and it didn't seem to help. She's also tried a probiotic. She sees scant red blood with her stools on occasion which she has attributed to internal hemorrhoids. She remains fatigued and also endorses night sweats. No fevers. Weight has been up 5 pounds over the last month but she is down overall from when symptoms started in mid October.  Again symptoms started acutely 1 Sunday in October as previously described in my note on  10/03/2016  Her only medicine currently is oral contraceptive pill started by gynecology. Periods have been regular now light. Prior to OCP she had very sporadic menstruation.  Since her office visit she had an upper endoscopy and colonoscopy. Her upper endoscopy was unrevealing with normal gastric biopsies and small bowel biopsies. Her colon was tortuous though normal. Terminal ileum was deeply intubated and normal. Small internal hemorrhoids were seen on retroflexion.  Cross-sectional imaging was repeated recently in the form of MRI abdomen and pelvis with contrast. This was unremarkable. I have reviewed it today.  Overall symptoms are better but she continues to have the pain and just simply does not feel herself. She was able to complete her semester at [employer] and is currently home for winter break.  I have also discussed the case with Dr. Donne Hazel  Review of Systems  As per history of present illness, otherwise negative  Current Medications, Allergies, Past Medical History, Past Surgical History, Family History and Social History were reviewed in [facility] record.     Objective:   Physical Exam BP 110/78 (BP Location: Left Arm, Patient Position: Sitting, Cuff Size: Normal)   Pulse 64   Ht 5' 7"  (1.702 m) Comment: height measured without shoes  Wt 134 lb (60.8 kg)   LMP 11/27/2016   BMI 20.99 kg/m  Constitutional: Well-developed and well-nourished. No distress. HEENT: Normocephalic and atraumatic. Oropharynx is clear and moist. No oropharyngeal exudate. Conjunctivae are normal.  No scleral icterus. Neck: Neck supple. Trachea midline. Cardiovascular: Normal rate, regular rhythm and intact distal  pulses. No M/R/G Pulmonary/chest: Effort normal and breath sounds normal. No wheezing, rales or rhonchi. Abdominal: Soft, Significant tenderness in the right lower quadrant without rebound or guarding, nondistended. Bowel sounds active throughout. There are no masses  palpable. No hepatosplenomegaly. Extremities: no clubbing, cyanosis, or edema Lymphadenopathy: No cervical adenopathy noted. Neurological: Alert and oriented to person place and time. Skin: Skin is warm and dry. No rashes noted. Psychiatric: Normal mood and affect. Behavior is normal.   MRI ABDOMEN AND PELVIS WITHOUT AND WITH CONTRAST   TECHNIQUE: Multiplanar multisequence MR imaging of the abdomen and pelvis was performed both before and after the administration of intravenous contrast.   CONTRAST:  4m MULTIHANCE GADOBENATE DIMEGLUMINE 529 MG/ML IV SOLN   COMPARISON:  CT on 09/25/2016, lumbar spine MRI on 12/22/2012, and pelvic ultrasound on 07/30/2012   FINDINGS: COMBINED FINDINGS FOR BOTH MR ABDOMEN AND PELVIS   Lower Chest: No acute findings.   Hepatobiliary: No masses identified. Gallbladder is unremarkable. No evidence of biliary ductal dilatation.   Pancreas:  No mass or inflammatory changes.   Spleen: Within normal limits in size and appearance.   Adrenals/Urinary Tract: No masses identified. No evidence of hydronephrosis.   Stomach/Bowel: No evidence of obstruction, inflammatory process or abnormal fluid collections.   Vascular/Lymphatic: No pathologically enlarged lymph nodes. No abdominal aortic aneurysm.   Reproductive: Uterus is retroflexed but normal in size. No fibroids identified. No evidence of endometrial thickening. Normal appearance of cervix.   The right ovary is normal in appearance. The left ovary is also normal in appearance. Simple appearing cyst is seen in the left adnexa, adjacent to the left ovary. This measures 2.7 x 3.8 cm and shows no significant change in size compared to previous studies dating back to 2013. This is most consistent with a benign paraovarian cyst.   Other:  Trace amount of free fluid noted in pelvis.   Musculoskeletal:  No suspicious bone lesions identified.   IMPRESSION: Negative abdomen MRI. No acute  findings or other significant abnormality identified.   3.8 cm left adnexal simple cyst located adjacent to left ovary. This shows no significant change compared to previous studies dating back to 2013, and is most consistent with benign paraovarian cyst. No other pelvic masses or acute findings identified.     Electronically Signed   By: JEarle GellM.D.   On: 12/07/2016 09:55   Prior GI pathogen panel negative    Assessment & Plan:  20year old female with little past medical history other than over the last 3 months who returns for follow-up.   1. RLQ pain/loose stools/GERD with dyspeptic symptoms/night sweats -- I do not have explanation for her constellation of symptoms. She is understandably frustrated due to the lack of diagnosis. Some of her symptoms may be related to irritable bowel possibly exacerbated by the stress of not understanding her right lower quadrant abdominal pain. I recommended that she begin pantoprazole 40 mg daily for 1 month to try to help upper GI symptoms specifically heartburn and indigestion. I've asked that she notify me if loose stools worsen with pantoprazole. I'm also going to treat irritable bowel with diarrhea with rifaximin 550 mg 3 times a day 14 days. She is asked to notify me if intolerance or side effects with this medicine --Labs today to include: TSH, celiac panel, high sensitivity CRP, ESR, ANA, vitamin B12, vitamin D, CBC, iron studies, random cortisol, and serum gastrin --If no response to pantoprazole and/or rifaximin and labs unrevealing I recommended to both  the patient and her mother that she contact Dr. Barton Dubois at [facility] to discuss her symptoms. Dr. Barton Dubois saw her initially when symptoms started in [place]. Tertiary center evaluation felt the next step if symptoms fail to improve. --Cutaneous nerve entrapment pain considered given her tenderness in the right lower quadrant and worsening with movement. Trigger point injection could  be considered as well though overall this is a fairly rare phenomenon.  25 minutes spent with the patient today. Greater than 50% was spent in counseling and coordination of care with the patient

## 2016-12-16 LAB — TISSUE TRANSGLUTAMINASE, IGA: TISSUE TRANSGLUTAMINASE AB, IGA: 1 U/mL (ref <4)

## 2016-12-17 LAB — TSH: TSH: 0.87 u[IU]/mL (ref 0.35–4.50)

## 2016-12-17 LAB — FERRITIN: FERRITIN: 15.9 ng/mL (ref 10.0–291.0)

## 2016-12-17 LAB — VITAMIN D 25 HYDROXY (VIT D DEFICIENCY, FRACTURES): VITD: 35.46 ng/mL (ref 30.00–100.00)

## 2016-12-17 LAB — ANA: ANA: NEGATIVE

## 2016-12-17 LAB — VITAMIN B12: VITAMIN B 12: 248 pg/mL (ref 211–911)

## 2016-12-18 ENCOUNTER — Telehealth: Payer: Self-pay | Admitting: Internal Medicine

## 2016-12-18 LAB — GASTRIN: GASTRIN: 24 pg/mL (ref <=100)

## 2016-12-18 NOTE — Telephone Encounter (Signed)
Pharmacy calling to let [facility] know they received the referral and no PA is needed so they will go ahead and reach out to the patient for delivery

## 2016-12-20 ENCOUNTER — Telehealth: Payer: Self-pay | Admitting: Internal Medicine

## 2016-12-20 NOTE — Telephone Encounter (Signed)
The Xifaxan did not require any prior authorization and there is not a co-pay. [facility] has called to the home number but had to leave a message. I called back to the mother and advised her of this. She will call [facility] to arrange the shipment of the medication.

## 2017-02-07 DIAGNOSIS — H04123 Dry eye syndrome of bilateral lacrimal glands: Secondary | ICD-10-CM | POA: Diagnosis not present

## 2017-02-27 DIAGNOSIS — B360 Pityriasis versicolor: Secondary | ICD-10-CM | POA: Diagnosis not present

## 2017-05-26 DIAGNOSIS — Z119 Encounter for screening for infectious and parasitic diseases, unspecified: Secondary | ICD-10-CM | POA: Diagnosis not present

## 2017-05-27 DIAGNOSIS — N390 Urinary tract infection, site not specified: Secondary | ICD-10-CM | POA: Diagnosis not present

## 2017-05-27 DIAGNOSIS — N398 Other specified disorders of urinary system: Secondary | ICD-10-CM | POA: Diagnosis not present

## 2017-05-28 DIAGNOSIS — Z23 Encounter for immunization: Secondary | ICD-10-CM | POA: Diagnosis not present

## 2017-06-09 DIAGNOSIS — N390 Urinary tract infection, site not specified: Secondary | ICD-10-CM | POA: Diagnosis not present

## 2017-06-09 DIAGNOSIS — N398 Other specified disorders of urinary system: Secondary | ICD-10-CM | POA: Diagnosis not present

## 2017-09-26 DIAGNOSIS — Z23 Encounter for immunization: Secondary | ICD-10-CM | POA: Diagnosis not present

## 2017-11-06 DIAGNOSIS — Z01419 Encounter for gynecological examination (general) (routine) without abnormal findings: Secondary | ICD-10-CM | POA: Diagnosis not present

## 2017-11-06 DIAGNOSIS — Z6821 Body mass index (BMI) 21.0-21.9, adult: Secondary | ICD-10-CM | POA: Diagnosis not present

## 2017-11-08 IMAGING — CT CT ABD-PELV W/ CM
2 of 4 series · 17 of 46 positions shown, 19 images · IV contrast (ISOVUE)
Comparison: None.

CLINICAL DATA: Right lower quadrant pain for several days

EXAM:
CT ABDOMEN AND PELVIS WITH CONTRAST
TECHNIQUE: Multidetector CT imaging of the abdomen and pelvis was performed
using the standard protocol following bolus administration of
intravenous contrast.
CONTRAST:  100mL [id number] IOPAMIDOL (XOEX86-A33) INJECTION 61%

[Series 2: rtn a/p with · axial · 0.70mm/px · z∈[-570,-146]mm · 14 of 96 slices shown, 16 images]
[im 6/96  soft-tissue]
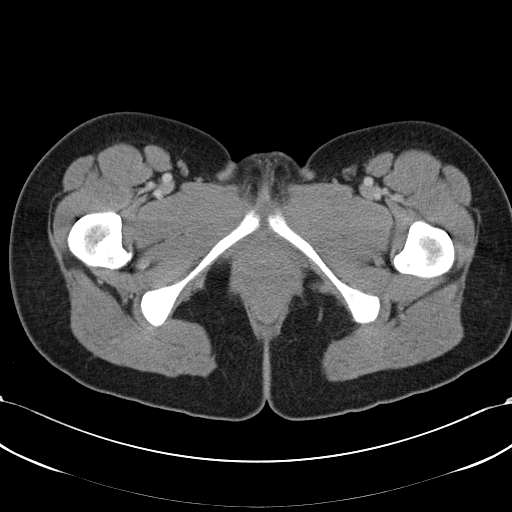
[im 6/96  bone]
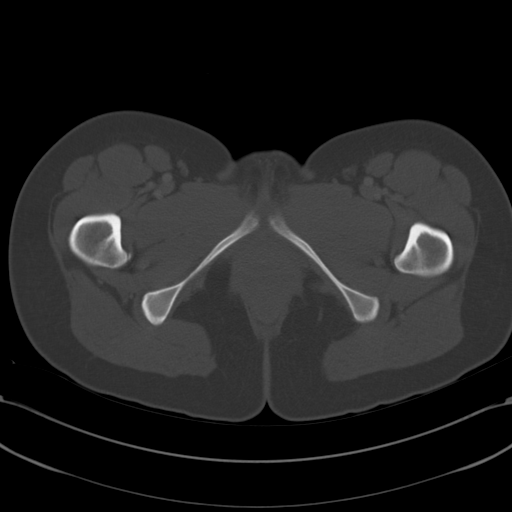
[im 11/96  soft-tissue]
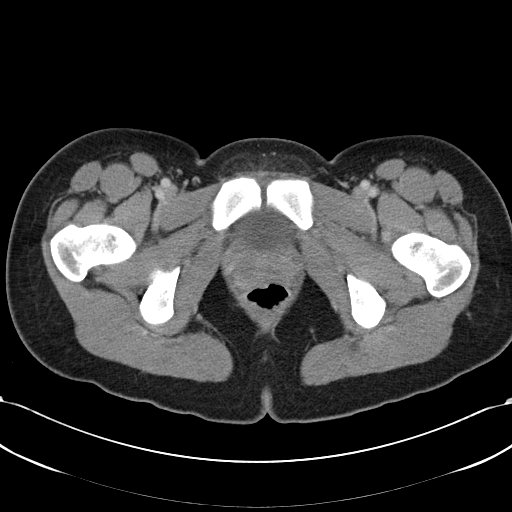
[im 21/96  soft-tissue]
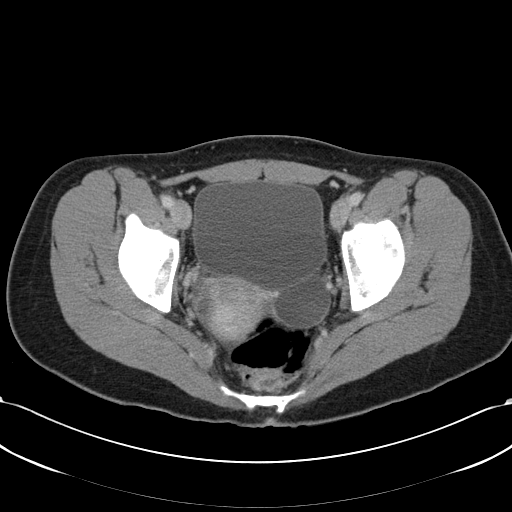
[im 26/96  soft-tissue]
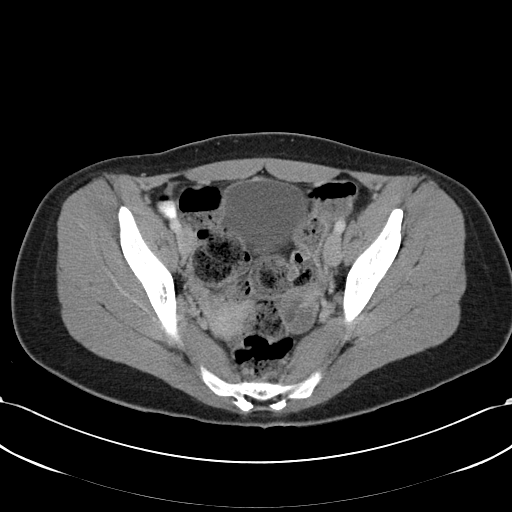
[im 31/96  soft-tissue]
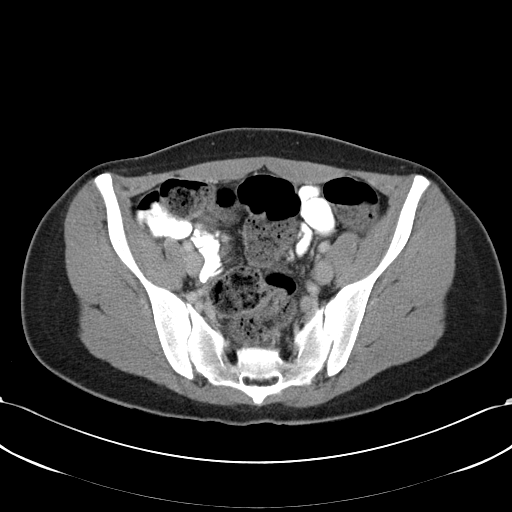
[im 41/96  soft-tissue]
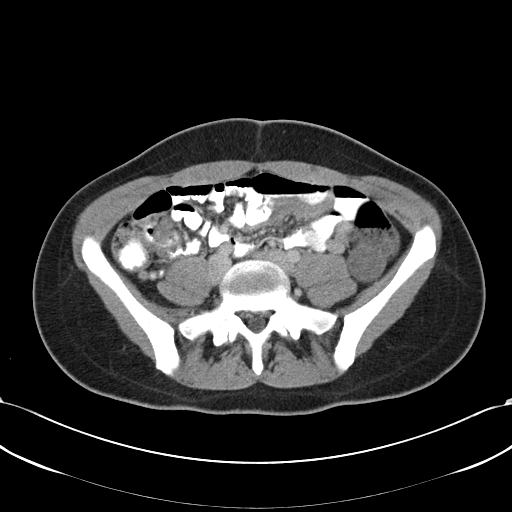
[im 46/96  soft-tissue]
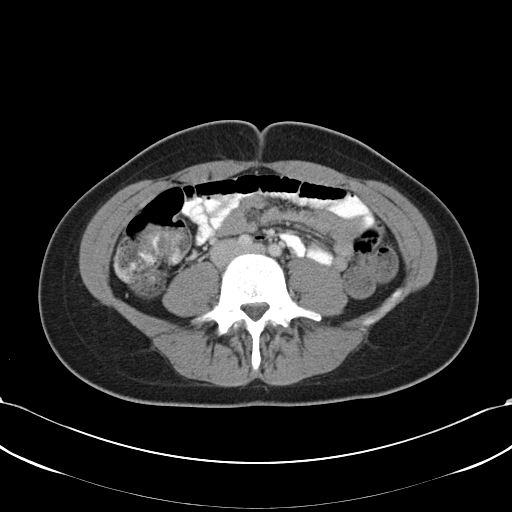
[im 51/96  soft-tissue]
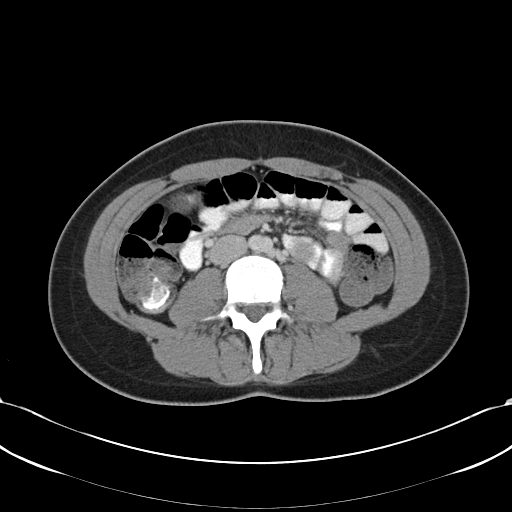
[im 56/96  soft-tissue]
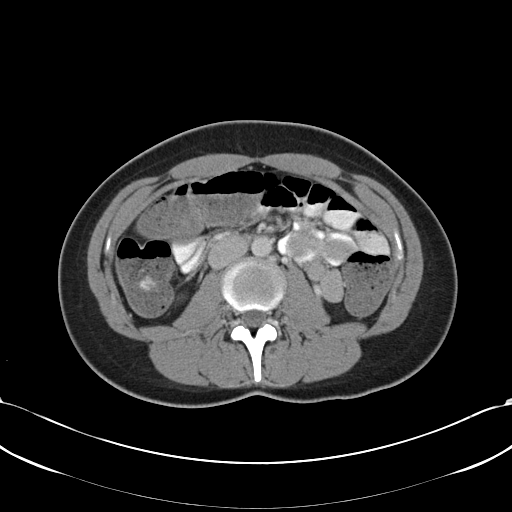
[im 56/96  bone]
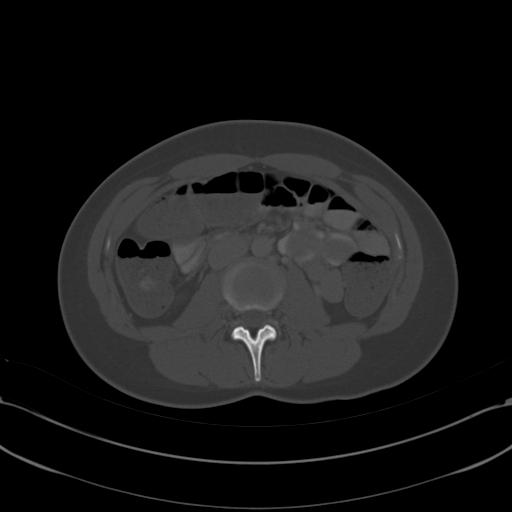
[im 66/96  soft-tissue]
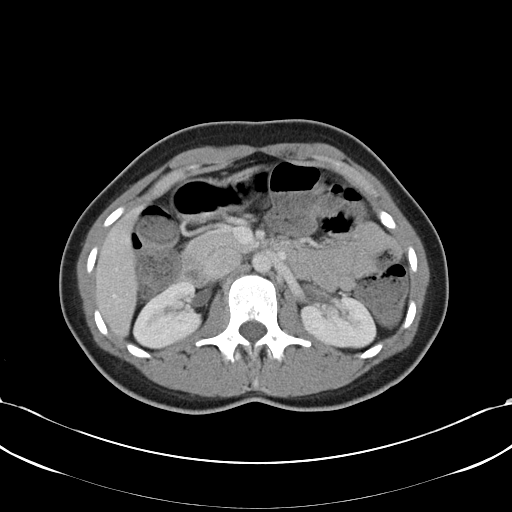
[im 71/96  soft-tissue]
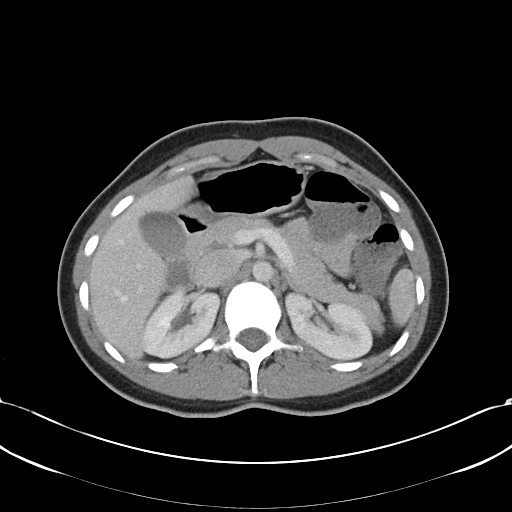
[im 76/96  soft-tissue]
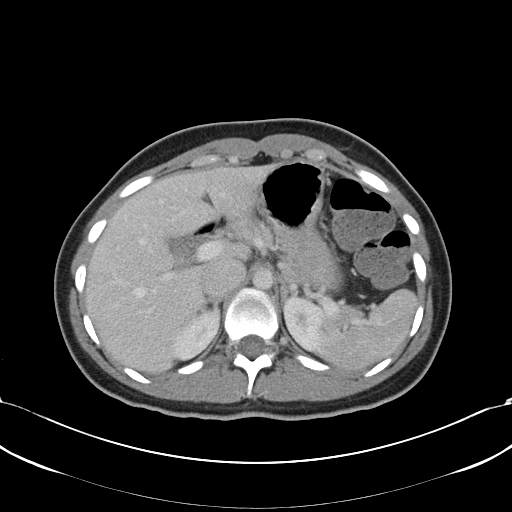
[im 86/96  soft-tissue]
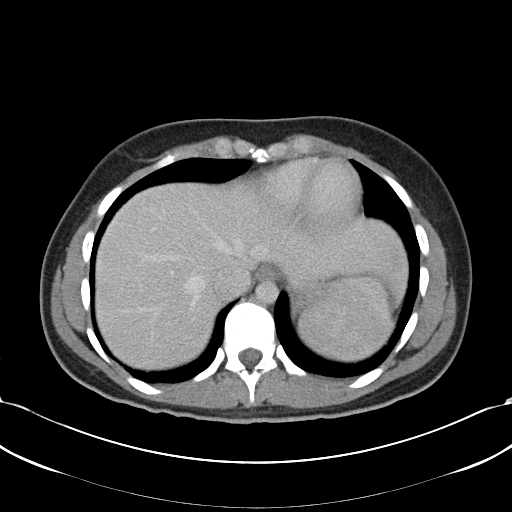
[im 91/96  soft-tissue]
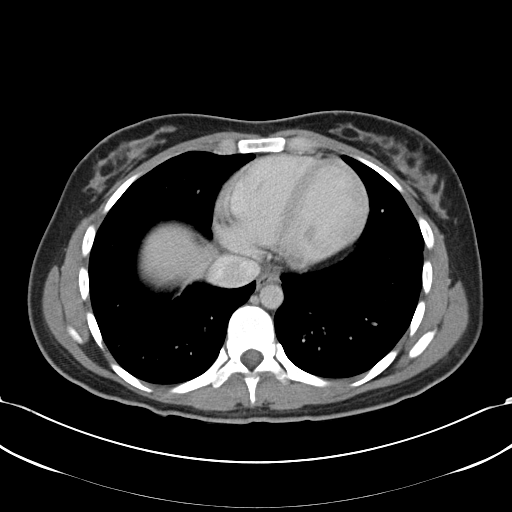

[Series 602: <mpr thick range> · coronal · 0.93mm/px · 3 of 110 slices shown]
[im 37/110  soft-tissue]
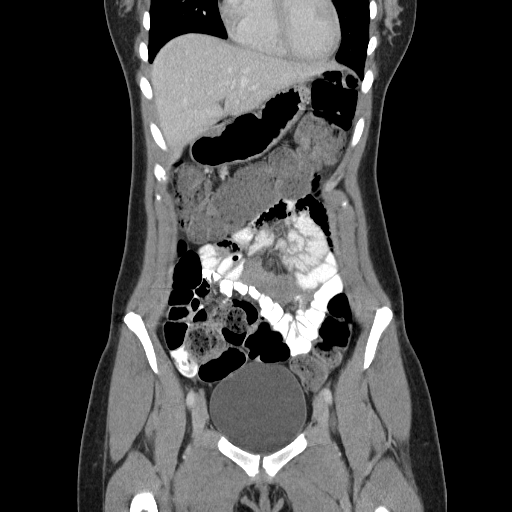
[im 49/110  soft-tissue]
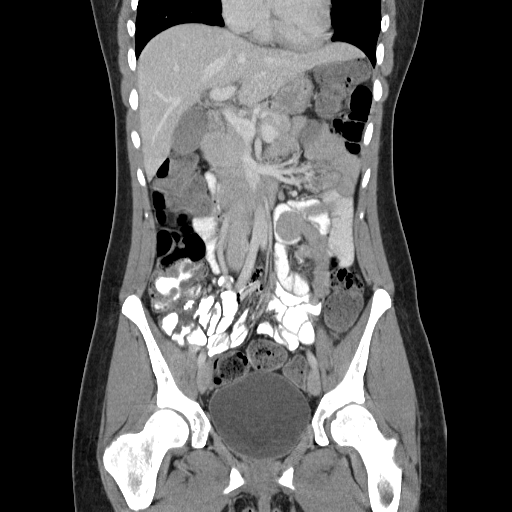
[im 61/110  soft-tissue]
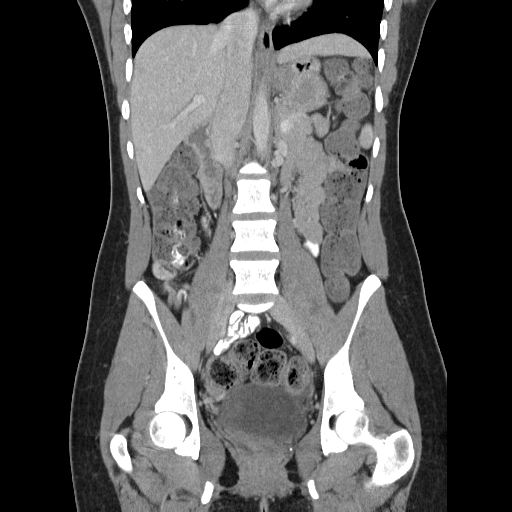

[17 of 46 positions shown; findings below may reference images not displayed]

FINDINGS: Lower chest: No acute abnormality.

Hepatobiliary: No focal liver abnormality is seen. No gallstones,
gallbladder wall thickening, or biliary dilatation.

Pancreas: Unremarkable. No pancreatic ductal dilatation or
surrounding inflammatory changes.

Spleen: Normal in size without focal abnormality.

Adrenals/Urinary Tract: Adrenal glands are unremarkable. Kidneys are
normal, without renal calculi, focal lesion, or hydronephrosis.
Bladder is unremarkable.

Stomach/Bowel: Stomach is within normal limits. Appendix appears
normal. No evidence of bowel wall thickening, distention, or
inflammatory changes. The degree of retained fecal material is noted
likely related to some degree of constipation.

Vascular/Lymphatic: No significant vascular findings are present. No
enlarged abdominal or pelvic lymph nodes.

Reproductive: Uterus is within normal limits. Bilateral ovarian
cystic changes are seen. The largest of these lies on the left
measuring 4.0 cm. These were present on a prior MRI dated 2980.

Other: No abdominal wall hernia or abnormality. No abdominopelvic
ascites.

Musculoskeletal: No acute or significant osseous findings.
IMPRESSION: Bilateral ovarian cystic changes left greater than right stable from
prior MRI examination.

Mild constipation.

No other focal abnormality is seen.

## 2018-02-10 DIAGNOSIS — Z5181 Encounter for therapeutic drug level monitoring: Secondary | ICD-10-CM | POA: Diagnosis not present

## 2018-02-10 DIAGNOSIS — T7840XA Allergy, unspecified, initial encounter: Secondary | ICD-10-CM | POA: Diagnosis not present

## 2018-02-12 ENCOUNTER — Other Ambulatory Visit: Payer: Self-pay

## 2018-02-12 ENCOUNTER — Telehealth: Payer: Self-pay

## 2018-02-12 MED ORDER — RIFAXIMIN 550 MG PO TABS: 550.0000 mg | ORAL_TABLET | Freq: Three times a day (TID) | ORAL | 0 refills | Status: DC

## 2018-02-12 NOTE — Telephone Encounter (Signed)
-----   Message from Beverley FiedlerJay M Pyrtle, MD sent at 02/11/2018  4:11 PM EST ----- Please prescribe rifaximin 550 mg 3 times daily times 14 days for IBS D  Patient with history of IBS D with excellent response to rifaximin about 6 months ago  Please check with the patient to see if [facility] would [facility] or here locally.

## 2018-02-12 NOTE — Telephone Encounter (Signed)
Prescription for xifaxan sent to [facility].

## 2018-02-13 NOTE — Telephone Encounter (Signed)
Noted  

## 2018-02-13 NOTE — Telephone Encounter (Signed)
[facility] states Jeneen RinksXifaxin is approved but copay is 500 dollars will be reaching out to the patient.

## 2018-02-16 DIAGNOSIS — Z5181 Encounter for therapeutic drug level monitoring: Secondary | ICD-10-CM | POA: Diagnosis not present

## 2018-02-16 DIAGNOSIS — T7840XD Allergy, unspecified, subsequent encounter: Secondary | ICD-10-CM | POA: Diagnosis not present

## 2018-02-19 ENCOUNTER — Telehealth: Payer: Self-pay

## 2018-02-19 NOTE — Telephone Encounter (Signed)
[facility] called pt to let her know how much her Xifaxan was going to be and she has decided not to receive Xifaxan at this time.

## 2018-02-26 ENCOUNTER — Encounter: Payer: Self-pay | Admitting: Allergy

## 2018-02-26 ENCOUNTER — Ambulatory Visit (INDEPENDENT_AMBULATORY_CARE_PROVIDER_SITE_OTHER): Payer: 59 | Admitting: Allergy

## 2018-02-26 VITALS — BP 120/68 | HR 63 | Temp 97.5°F | Resp 17 | Ht 67.5 in | Wt 135.6 lb

## 2018-02-26 DIAGNOSIS — T7840XD Allergy, unspecified, subsequent encounter: Secondary | ICD-10-CM

## 2018-02-26 MED ORDER — EPINEPHRINE 0.3 MG/0.3ML IJ SOAJ: 0.3000 mg | Freq: Once | INTRAMUSCULAR | 2 refills | Status: AC

## 2018-02-26 MED ORDER — EPINEPHRINE 0.3 MG/0.3ML IJ SOAJ: 0.3000 mg | Freq: Once | INTRAMUSCULAR | 2 refills | Status: DC

## 2018-02-26 MED ORDER — MONTELUKAST SODIUM 10 MG PO TABS: 10.0000 mg | ORAL_TABLET | Freq: Every day | ORAL | 5 refills | Status: DC

## 2018-02-26 NOTE — Progress Notes (Signed)
New Patient Note  RE: Karina Atkins MRN: [id number] DOB: 22-Jun-1996 Date of Office Visit: 02/26/2018  Referring provider: Geoffry Paradise, MD Primary care provider: Geoffry Paradise, MD  Chief Complaint: allergic reaction  History of present illness: Karina Atkins is a 22 y.o. female presenting today for consultation for allergic reaction.  She states she had first reaction about 2 weeks ago after eating a Cliff bar containing peanuts.  Symptoms included hives and throat itch and tightness sensation without difficulty breathing or swallowing.  She states she was eating Cliff bars almost daily and was also eating other peanut products without issue previously.  She stopped eating Cliff bars.  She went to student health at [facility] with this episode and she reports she received epi injection and steroids.  He has had several more reactions since after including after eating a biscuit and several after eating different kinds of salads.  One of the salads she recalls contained onions, avocado and ranch dressing and she also had bread.   She was seen again for similar symptoms at [facility] and states she had labs done but that they lost the labs so she does not know any results.  She was advised to take 2 tabs of Zyrtec and 1 Zantac as well as benadryl at night.  She was also prescribed an epipen but her pharmacy is having trouble finding a supply.  She denies any preceding illness before the reactions started.  She states the hives last about couple hours and do resolve completely without leaving any marks/bruising.  She denies any swelling, fevers or joint aches/pains and denies any N/V/D, respiratory of CV related symptoms.  She does endorse the hives have started each time within 30 minutes of eating.   She reports last year she was having GI issues and saw a GI specialist and had extensive testing including celiac testing and endoscopies that was normal.  She was diagnosed with IBS.    She denies any  history of asthma, eczema or allergic rhinitis.   Review of systems: ROS  All other systems negative unless noted above in HPI  Past medical history: Past Medical History:  Diagnosis Date  . Anemia   . Internal hemorrhoids     Past surgical history: Past Surgical History:  Procedure Laterality Date  . none      Family history:  Family History  Problem Relation Age of Onset  . Breast cancer Mother   . Colon polyps Mother   . Irritable bowel syndrome Paternal Grandmother   . COPD Paternal Grandmother   . Pancreatic cancer Other   . Colon polyps Father   . Kidney disease Father   . Asthma Brother   . Esophageal cancer Neg Hx   . Colon cancer Neg Hx   . Stomach cancer Neg Hx   . Inflammatory bowel disease Neg Hx     Social history: She lives in a home without carpeting with gas heating and central cooling.  No pets in the home.  No concern for water damage, mildew in home but there is concern for roaches in the home.  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  She denies a smoking history.    Medication List: Allergies as of 02/26/2018   No Known Allergies     Medication List        Accurate as of 02/26/18  1:29 PM. Always use your most recent med list.  BLISOVI 24 FE 1-20 MG-MCG(24) tablet Generic drug:  Norethindrone Acetate-Ethinyl Estrad-FE Take 1 tablet by mouth daily.   EPINEPHrine 0.3 mg/0.3 mL Soaj injection Commonly known as:  EPI-PEN Inject 0.3 mLs (0.3 mg total) into the muscle once for 1 dose.   Fish Oil 1000 MG Caps Take by mouth.   ketoconazole 2 % shampoo Commonly known as:  NIZORAL apply to scalp ONCE or two times a week as directed and then use REGULAR SHAMPOO   montelukast 10 MG tablet Commonly known as:  SINGULAIR Take 1 tablet (10 mg total) by mouth at bedtime.       Known medication allergies: No Known Allergies   Physical examination: Blood pressure 120/68, pulse 63, temperature (!) 97.5 F (36.4  C), resp. rate 17, height 5' 7.5" (1.715 m), weight 135 lb 9.6 oz (61.5 kg), SpO2 99 %.  General: Alert, interactive, in no acute distress. HEENT: PERRLA, TMs pearly gray, turbinates non-edematous without discharge, post-pharynx non erythematous. Neck: Supple without lymphadenopathy. Lungs: Clear to auscultation without wheezing, rhonchi or rales. {no increased work of breathing. CV: Normal S1, S2 without murmurs. Abdomen: Nondistended, nontender. Skin: Warm and dry, without lesions or rashes. Extremities:  No clubbing, cyanosis or edema. Neuro:   Grossly intact.  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  Peanut, wheat, sesame, milk, egg, barley, rye, avocado, onion were negative.   Allergy testing results were read and interpreted by provider, documented by clinical staff.  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  Please avoid soybean products at this time    - will obtain tryptase level as well as IgE levels to foods tested for today to ensure you do not have other sensitivities.      - have access to self-injectable epinephrine (Epipen or AuviQ) 0.3mg  at all times    - follow emergency action plan in case of allergic reaction    - would recommend taking antihistamine daily like Zyrtec as well as Zantac as previously recommended to help decrease chance of reactions    - would also recommend taking Singulair 10mg  daily which is an anti-leukotriene medication which also may help to decrease change of reactions  Follow-up 6 months or sooner if needed  I appreciate the opportunity to take part in Karina Atkins's care. Please do not hesitate to contact me with questions.  Sincerely,   Margo AyeShaylar Sharol Croghan, MD Allergy/Immunology [facility]

## 2018-02-26 NOTE — Patient Instructions (Addendum)
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  Please avoid soybean products at this time    - will obtain tryptase level as well as IgE levels to foods tested for today to ensure you do not have other sensitivities.      - have access to self-injectable epinephrine (Epipen or AuviQ) 0.3mg  at all times    - follow emergency action plan in case of allergic reaction    - would recommend taking antihistamine daily like Zyrtec as well as Zantac as previously recommended to help decrease chance of reactions    - would also recommend taking Singulair 10mg  daily which is an anti-leukotriene medication which also may help to decrease change of reactions  Follow-up 6 months or sooner if needed

## 2018-03-02 LAB — ALLERGEN AVOCADO F96

## 2018-03-02 LAB — ALLERGEN, PEANUT F13

## 2018-03-02 LAB — ALLERGEN SESAME F10

## 2018-03-02 LAB — ALLERGEN SOYBEAN: Soybean IgE: <0.1 kU/L

## 2018-03-02 LAB — TRYPTASE: TRYPTASE: 9.1 ug/L (ref 2.2–13.2)

## 2018-03-02 LAB — ALLERGEN, RYE, F5

## 2018-03-02 LAB — ALLERGEN, ONION, F48: Allergen Onion IgE: <0.1 kU/L

## 2018-03-02 LAB — ALLERGEN MILK: Milk IgE: <0.1 kU/L

## 2018-03-02 LAB — ALLERGEN BARLEY F6

## 2018-03-02 LAB — ALLERGEN, WHEAT, F4: Wheat IgE: <0.1 kU/L

## 2018-03-02 LAB — ALLERGEN EGG WHITE F1: Egg White IgE: <0.1 kU/L

## 2018-03-05 ENCOUNTER — Other Ambulatory Visit: Payer: Self-pay

## 2018-03-05 DIAGNOSIS — T7840XD Allergy, unspecified, subsequent encounter: Secondary | ICD-10-CM

## 2018-03-19 ENCOUNTER — Ambulatory Visit: Payer: Self-pay | Admitting: Allergy

## 2018-03-30 DIAGNOSIS — H04123 Dry eye syndrome of bilateral lacrimal glands: Secondary | ICD-10-CM | POA: Diagnosis not present

## 2018-04-26 ENCOUNTER — Other Ambulatory Visit: Payer: Self-pay | Admitting: Allergy

## 2018-04-26 DIAGNOSIS — T7840XD Allergy, unspecified, subsequent encounter: Secondary | ICD-10-CM

## 2018-05-11 DIAGNOSIS — J329 Chronic sinusitis, unspecified: Secondary | ICD-10-CM | POA: Diagnosis not present

## 2018-05-11 DIAGNOSIS — R05 Cough: Secondary | ICD-10-CM | POA: Diagnosis not present

## 2018-05-11 DIAGNOSIS — Z6821 Body mass index (BMI) 21.0-21.9, adult: Secondary | ICD-10-CM | POA: Diagnosis not present

## 2018-05-21 DIAGNOSIS — Z0000 Encounter for general adult medical examination without abnormal findings: Secondary | ICD-10-CM | POA: Diagnosis not present

## 2018-05-25 DIAGNOSIS — Z111 Encounter for screening for respiratory tuberculosis: Secondary | ICD-10-CM | POA: Diagnosis not present

## 2018-06-18 DIAGNOSIS — N760 Acute vaginitis: Secondary | ICD-10-CM | POA: Diagnosis not present

## 2018-06-18 DIAGNOSIS — R102 Pelvic and perineal pain: Secondary | ICD-10-CM | POA: Diagnosis not present

## 2018-06-18 DIAGNOSIS — N898 Other specified noninflammatory disorders of vagina: Secondary | ICD-10-CM | POA: Diagnosis not present

## 2018-12-15 DIAGNOSIS — Z6822 Body mass index (BMI) 22.0-22.9, adult: Secondary | ICD-10-CM | POA: Diagnosis not present

## 2018-12-15 DIAGNOSIS — Z01419 Encounter for gynecological examination (general) (routine) without abnormal findings: Secondary | ICD-10-CM | POA: Diagnosis not present

## 2019-05-12 DIAGNOSIS — H1045 Other chronic allergic conjunctivitis: Secondary | ICD-10-CM | POA: Diagnosis not present

## 2022-02-06 DIAGNOSIS — L718 Other rosacea: Secondary | ICD-10-CM | POA: Diagnosis not present

## 2022-02-06 DIAGNOSIS — B351 Tinea unguium: Secondary | ICD-10-CM | POA: Diagnosis not present

## 2022-03-13 DIAGNOSIS — Z0000 Encounter for general adult medical examination without abnormal findings: Secondary | ICD-10-CM | POA: Diagnosis not present

## 2022-03-13 DIAGNOSIS — Z008 Encounter for other general examination: Secondary | ICD-10-CM | POA: Diagnosis not present

## 2022-03-13 DIAGNOSIS — R030 Elevated blood-pressure reading, without diagnosis of hypertension: Secondary | ICD-10-CM | POA: Diagnosis not present

## 2022-03-13 DIAGNOSIS — Z1331 Encounter for screening for depression: Secondary | ICD-10-CM | POA: Diagnosis not present

## 2022-03-13 DIAGNOSIS — R82998 Other abnormal findings in urine: Secondary | ICD-10-CM | POA: Diagnosis not present

## 2022-03-13 DIAGNOSIS — Z20828 Contact with and (suspected) exposure to other viral communicable diseases: Secondary | ICD-10-CM | POA: Diagnosis not present

## 2022-04-09 DIAGNOSIS — Z6821 Body mass index (BMI) 21.0-21.9, adult: Secondary | ICD-10-CM | POA: Diagnosis not present

## 2022-04-09 DIAGNOSIS — Z01419 Encounter for gynecological examination (general) (routine) without abnormal findings: Secondary | ICD-10-CM | POA: Diagnosis not present

## 2022-04-09 DIAGNOSIS — Z3040 Encounter for surveillance of contraceptives, unspecified: Secondary | ICD-10-CM | POA: Diagnosis not present

## 2022-04-09 DIAGNOSIS — N760 Acute vaginitis: Secondary | ICD-10-CM | POA: Diagnosis not present
